# Patient Record
Sex: Female | Born: 1995 | Race: Black or African American | Hispanic: No | Marital: Single
Health system: Midwestern US, Community
[De-identification: ages and names within clinical notes are randomized; demographics above are authoritative.]

---
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
## 2016-04-18 ENCOUNTER — Emergency Department: Admit: 2016-04-18 | Payer: BLUE CROSS/BLUE SHIELD | Primary: Pediatrics

## 2016-04-18 ENCOUNTER — Inpatient Hospital Stay
Admit: 2016-04-18 | Discharge: 2016-04-18 | Disposition: A | Discharge status: Home / Self Care | Payer: BLUE CROSS/BLUE SHIELD | Attending: Emergency Medicine

## 2016-04-18 DIAGNOSIS — R0789 Other chest pain: Secondary | ICD-10-CM

## 2016-04-18 LAB — EKG 12-LEAD
Atrial Rate: 83 {beats}/min
Diagnosis: NORMAL
P Axis: 91 degrees
P-R Interval: 152 ms
Q-T Interval: 370 ms
QRS Duration: 98 ms
QTc Calculation (Bazett): 434 ms
R Axis: 77 degrees
T Axis: 62 degrees
Ventricular Rate: 83 {beats}/min

## 2016-04-18 LAB — POC URINE MACROSCOPIC
Bilirubin: NEGATIVE
Glucose: NEGATIVE mg/dl
Ketone: NEGATIVE mg/dl
Nitrites: NEGATIVE
Protein: NEGATIVE mg/dl
Specific gravity: 1.02 (ref 1.005–1.030)
Urobilinogen: 0.2 EU/dl (ref 0.0–1.0)
pH (UA): 7 (ref 5–9)

## 2016-04-18 LAB — METABOLIC PANEL, COMPREHENSIVE
ALT (SGPT): 19 U/L (ref 12–78)
AST (SGOT): 12 U/L — ABNORMAL LOW (ref 15–37)
Albumin: 3.8 gm/dl (ref 3.4–5.0)
Alk. phosphatase: 84 U/L (ref 45–117)
BUN: 13 mg/dl (ref 7–25)
Bilirubin, total: 0.4 mg/dl (ref 0.2–1.0)
CO2: 29 mEq/L (ref 21–32)
Calcium: 9.1 mg/dl (ref 8.5–10.1)
Chloride: 108 mEq/L — ABNORMAL HIGH (ref 98–107)
Creatinine: 1.2 mg/dl (ref 0.6–1.3)
GFR est AA: >60
GFR est non-AA: >60
Glucose: 76 mg/dl (ref 74–106)
Potassium: 3.4 mEq/L — ABNORMAL LOW (ref 3.5–5.1)
Protein, total: 7.5 gm/dl (ref 6.4–8.2)
Sodium: 145 mEq/L (ref 136–145)

## 2016-04-18 LAB — CBC WITH AUTOMATED DIFF
BASOPHILS: 0.5 % (ref 0–3)
EOSINOPHILS: 6.4 % — ABNORMAL HIGH (ref 0–5)
HCT: 40 % (ref 37.0–50.0)
HGB: 12.8 gm/dl — ABNORMAL LOW (ref 13.0–17.2)
IMMATURE GRANULOCYTES: 0.2 % (ref 0.0–3.0)
LYMPHOCYTES: 29.7 % (ref 28–48)
MCH: 26.3 pg (ref 25.4–34.6)
MCHC: 32 gm/dl (ref 30.0–36.0)
MCV: 82.3 fL (ref 80.0–98.0)
MONOCYTES: 10.1 % (ref 1–13)
MPV: 11.6 fL — ABNORMAL HIGH (ref 6.0–10.0)
NEUTROPHILS: 53.1 % (ref 34–64)
NRBC: 0 (ref 0–0)
PLATELET: 204 10*3/uL (ref 140–450)
RBC: 4.86 M/uL (ref 3.60–5.20)
RDW-SD: 46.4 — ABNORMAL HIGH (ref 36.4–46.3)
WBC: 5.8 10*3/uL (ref 4.0–11.0)

## 2016-04-18 LAB — EKG, 12 LEAD, INITIAL
Atrial Rate: 83 {beats}/min
Calculated P Axis: 91 degrees
Calculated R Axis: 77 degrees
Calculated T Axis: 62 degrees
Diagnosis: NORMAL
P-R Interval: 152 ms
Q-T Interval: 370 ms
QRS Duration: 98 ms
QTC Calculation (Bezet): 434 ms
Ventricular Rate: 83 {beats}/min

## 2016-04-18 LAB — POC HCG,URINE: HCG urine, QL: NEGATIVE

## 2016-04-18 LAB — POC URINE MICROSCOPIC

## 2016-04-18 NOTE — ED Triage Notes (Signed)
Pt states she has been having abdominal pain with vaginal bleeding and clots along with dizziness with the bleeding, states this has been going on for several months since IUD placed

## 2016-04-18 NOTE — ED Provider Notes (Signed)
[place]  Emergency Department Treatment Report    Patient: [name] Allen Age: 20 y.o. Sex: female    Date of Birth: November 15, 1995 Admit Date: 04/18/2016 PCP: Eliott Nine, MD   MRN: [id number]  CSN: [id number]     Room: ER31/ER31 Time Dictated: 12:22 PM      Chief Complaint   Chief Complaint   Patient presents with   ??? Vaginal Bleeding   ??? Dizziness   ??? Abdominal Pain       History of Present Illness   20 y.o. female presents to the ED today with multiple complaints, which she suspects are secondary to her [place] IUD placed (she believes) in February.  She reports daily bleeding since that time, associated with large clots.  She reports going through a pack of pads per day.  She reports having had some chest tightness/palpitations last night associated with near syncope.  She reports headaches, which she describes as severe migraines with ear buzzing for the last 3-4 days.  She reports that she frequently gets headaches, but these are different.  She has not taken any medication for these headaches, and she currently does not have one.  She reports they normally get better when she is sleeping.  She also complains of nausea and lower abdominal cramping since her IUD placement.  She is also having bilateral galactorrhea (L>R).  She denies current pregnancy.  She last had chest tightness this morning, which lasted about an hour and resolved with sleep.      Review of Systems   Constitutional: No fever, chills  Eyes: No visual symptoms.  ENT: No sore throat, runny nose   Respiratory: Shortness of breath during chest tightness episodes  Cardiovascular: As above  Gastrointestinal: Nausea and lower abdominal cramping  Genitourinary: As above  Musculoskeletal: No joint pain or swelling.  Integumentary: No rashes.  Neurological: As above  Denies complaints in all other systems.  Past Medical/Surgical History   No past medical history on file.  No past surgical history on file.   Hernia     Social History     Social History     Social History   ??? Marital status: SINGLE     Spouse name: N/A   ??? Number of children: N/A   ??? Years of education: N/A     Social History Main Topics   ??? Smoking status: Current Every Day Smoker   ??? Smokeless tobacco: Never Used   ??? Alcohol use No   ??? Drug use: No   ??? Sexual activity: Yes     Partners: Male     Birth control/ protection: Condom     Other Topics Concern   ??? Not on file     Social History Narrative       Family History   No family history on file.    Home Medications     (Not in a hospital admission)    Allergies   No Known Allergies    Physical Exam   ED Triage Vitals   Enc Vitals Group      BP 04/18/16 1152 132/92      Pulse (Heart Rate) 04/18/16 1152 74      Resp Rate 04/18/16 1152 20      Temp 04/18/16 1152 98.8 ??F (37.1 ??C)      Temp src --       O2 Sat (%) 04/18/16 1152 100 %      Weight 04/18/16 1138 106  lb      Height 04/18/16 1138 5' 3"       Head Cir --       Peak Flow --       Pain Score --       Pain Loc --       Pain Edu? --       Excl. in [place]? --      Constitutional: Patient appears well developed and well nourished. Marland Kitchen Appearance and behavior are age and situation appropriate.  HEENT: Conjunctiva clear. Mucous membranes moist.  Neck: supple, non tender.   Respiratory: lungs clear to auscultation, nonlabored respirations. No tachypnea or accessory muscle use.  Cardiovascular: heart regular rate and rhythm without murmur rubs or gallops.   Calves soft and non-tender. No peripheral edema or significant variscosities.    Gastrointestinal:  Abdomen soft, nontender without complaint of pain to palpation  Musculoskeletal: no deformities noted; moves all extremities without difficulty  Integumentary: warm and dry without rashes or lesions  Breasts:  Minimal white discharge from left nipple; no current discharge noted from right   Neurologic: alert and oriented x 3; No facial asymmetry or dysarthria.  Impression and Management Plan    20 year old female presents to the ED with multiple complaints, which she believes are related to her IUD, Liletta.  Will check labs, perform pelvic exam, rule out pregnancy, perform EKG/CXR for chest discomfort and proceed accordingly.  Patient PERC negative.   Diagnostic Studies   Lab:   Recent Results (from the past 12 hour(s))   CBC WITH AUTOMATED DIFF    Collection Time: 04/18/16 12:46 PM   Result Value Ref Range    WBC 5.8 4.0 - 11.0 1000/mm3    RBC 4.86 3.60 - 5.20 M/uL    HGB 12.8 (L) 13.0 - 17.2 gm/dl    HCT 40.0 37.0 - 50.0 %    MCV 82.3 80.0 - 98.0 fL    MCH 26.3 25.4 - 34.6 pg    MCHC 32.0 30.0 - 36.0 gm/dl    PLATELET 204 140 - 450 1000/mm3    MPV 11.6 (H) 6.0 - 10.0 fL    RDW-SD 46.4 (H) 36.4 - 46.3      NRBC 0 0 - 0      IMMATURE GRANULOCYTES 0.2 0.0 - 3.0 %    NEUTROPHILS 53.1 34 - 64 %    LYMPHOCYTES 29.7 28 - 48 %    MONOCYTES 10.1 1 - 13 %    EOSINOPHILS 6.4 (H) 0 - 5 %    BASOPHILS 0.5 0 - 3 %   METABOLIC PANEL, COMPREHENSIVE    Collection Time: 04/18/16 12:46 PM   Result Value Ref Range    Sodium 145 136 - 145 mEq/L    Potassium 3.4 (L) 3.5 - 5.1 mEq/L    Chloride 108 (H) 98 - 107 mEq/L    CO2 29 21 - 32 mEq/L    Glucose 76 74 - 106 mg/dl    BUN 13 7 - 25 mg/dl    Creatinine 1.2 0.6 - 1.3 mg/dl    GFR est AA >60.0      GFR est non-AA >60      Calcium 9.1 8.5 - 10.1 mg/dl    AST (SGOT) 12 (L) 15 - 37 U/L    ALT (SGPT) 19 12 - 78 U/L    Alk. phosphatase 84 45 - 117 U/L    Bilirubin, total 0.4 0.2 - 1.0 mg/dl  Protein, total 7.5 6.4 - 8.2 gm/dl    Albumin 3.8 3.4 - 5.0 gm/dl   EKG, 12 LEAD, INITIAL    Collection Time: 04/18/16 12:50 PM   Result Value Ref Range    Ventricular Rate 83 BPM    Atrial Rate 83 BPM    P-R Interval 152 ms    QRS Duration 98 ms    Q-T Interval 370 ms    QTC Calculation (Bezet) 434 ms    Calculated P Axis 91 degrees    Calculated R Axis 77 degrees    Calculated T Axis 62 degrees    Diagnosis       Normal sinus rhythm  Septal infarct , age undetermined   Possible Lateral infarct , age undetermined  Abnormal ECG  When compared with ECG of 24-May-2014 00:14,  Septal infarct is now present  No significant change was found  Confirmed by Tyrone Sage, M.D., Anjan ([id number]) on 04/18/2016 1:55:08 PM     POC URINE MACROSCOPIC    Collection Time: 04/18/16  1:13 PM   Result Value Ref Range    Glucose Negative NEGATIVE,Negative mg/dl    Bilirubin Negative NEGATIVE,Negative      Ketone Negative NEGATIVE,Negative mg/dl    Specific gravity 1.020 1.005 - 1.030      Blood Large (A) NEGATIVE,Negative      pH (UA) 7.0 5 - 9      Protein Negative NEGATIVE,Negative mg/dl    Urobilinogen 0.2 0.0 - 1.0 EU/dl    Nitrites Negative NEGATIVE,Negative      Leukocyte Esterase Trace (A) NEGATIVE,Negative      Color Yellow      Appearance Clear     POC URINE MICROSCOPIC    Collection Time: 04/18/16  1:13 PM   Result Value Ref Range    EPITHELIAL CELLS, SQUAMOUS OCCASIONAL /LPF    WBC 1-4 /HPF    RBC 1-4 /HPF    Bacteria OCCASIONAL /HPF   POC HCG,URINE    Collection Time: 04/18/16  1:15 PM   Result Value Ref Range    HCG urine, Ql. negative NEGATIVE,Negative,negative         Imaging:    Xr Chest Pa Lat    Result Date: 04/18/2016  Indication: Right chest pain for 2 days, smoking PA and lateral chest  The heart is normal in size. Lungs are clear and the bony thorax is intact. No effusions or  other abnormalities are seen.     Impression: Normal study.       EKG: sinus rhythm; no significant ST-T changes suggestive of ischemia  ED Course   Patient remained in stable condition. Pelvic exam revealed minimal blood in the vaginal vault.  Labs unremarkable.  EKG sinus rhythm.  I believe she needs further work-up, especially for her galactorrhea (possibly including prolactin level and MRI-brain).  She currently has no headache or chest discomfort.  Will have her follow up with PCP and OBGYN ASAP.    Medical Decision Making     Final Diagnosis       ICD-10-CM ICD-9-CM   1. Galactorrhea O92.6 611.6    2. Dysmenorrhea N94.6 625.3   3. Nonintractable headache, unspecified chronicity pattern, unspecified headache type R51 784.0   4. Chest tightness R07.89 786.59   5. Near syncope R55 780.2       Disposition   Patient discharged home in stable condition.     Payor: BLUE CR[employer] MEDICAID / Plan: [place] / Product Type: Managed Care Medicaid /  Minna Merritts, MD  April 18, 2016    My signature above authenticates this document and my orders, the final ??  diagnosis (es), discharge prescription (s), and instructions in the Epic ??  record.  If you have any questions please contact [phone].  ??  Nursing notes have been reviewed by the physician/ advanced practice ??  Clinician.    Dragon medical dictation software was used for portions of this report. Unintended voice recognition errors may occur.

## 2016-04-18 NOTE — ED Notes (Signed)
2:47 PM  04/18/16     Discharge instructions given to Melida QuitterShardaye R Righter   (name) with verbalization of understanding. Patient accompanied by friends.  Patient discharged with the following prescriptions none. Patient discharged to home (destination).      Coralee NorthAmanda Lyn Angely Dietz, RN

## 2016-06-19 ENCOUNTER — Encounter: Payer: Self-pay | Admitting: *Deleted

## 2016-06-19 ENCOUNTER — Emergency Department: Payer: BLUE CROSS/BLUE SHIELD

## 2016-06-19 ENCOUNTER — Emergency Department
Admission: EM | Admit: 2016-06-19 | Discharge: 2016-06-19 | Disposition: A | Discharge status: Home / Self Care | Payer: BLUE CROSS/BLUE SHIELD | Attending: Emergency Medicine | Admitting: Emergency Medicine

## 2016-06-19 DIAGNOSIS — N921 Excessive and frequent menstruation with irregular cycle: Secondary | ICD-10-CM

## 2016-06-19 DIAGNOSIS — T8389XA Other specified complication of genitourinary prosthetic devices, implants and grafts, initial encounter: Secondary | ICD-10-CM

## 2016-06-19 DIAGNOSIS — T8332XA Displacement of intrauterine contraceptive device, initial encounter: Secondary | ICD-10-CM | POA: Insufficient documentation

## 2016-06-19 DIAGNOSIS — N939 Abnormal uterine and vaginal bleeding, unspecified: Secondary | ICD-10-CM | POA: Diagnosis present

## 2016-06-19 DIAGNOSIS — Y762 Prosthetic and other implants, materials and accessory obstetric and gynecological devices associated with adverse incidents: Secondary | ICD-10-CM | POA: Diagnosis not present

## 2016-06-19 DIAGNOSIS — R102 Pelvic and perineal pain: Secondary | ICD-10-CM

## 2016-06-19 LAB — URINALYSIS COMPLETE WITH MICROSCOPIC (ARMC ONLY)
BACTERIA UA: NONE SEEN
BILIRUBIN URINE: NEGATIVE
Glucose, UA: NEGATIVE mg/dL
HGB URINE DIPSTICK: NEGATIVE
Ketones, ur: NEGATIVE mg/dL
NITRITE: NEGATIVE
PH: 7 (ref 5.0–8.0)
Protein, ur: NEGATIVE mg/dL
Specific Gravity, Urine: 1.019 (ref 1.005–1.030)

## 2016-06-19 LAB — POC URINE PREG, ED: Preg Test, Ur: NEGATIVE

## 2016-06-19 NOTE — ED Notes (Signed)
Patient transported to Ultrasound 

## 2016-06-19 NOTE — ED Triage Notes (Addendum)
States she feels like her birth control is falling out (IUD), and wants to be assessed

## 2016-06-19 NOTE — ED Provider Notes (Signed)
[facility] Emergency Department Provider Note  ____________________________________________  Time seen: Approximately 5:10 PM  I have reviewed the triage vital signs and the nursing notes.   HISTORY  Chief Complaint Abdominal Pain    HPI April Lee is a 20 y.o. female who complains of pelvic cramping and vaginal bleeding off and on for the past few weeks. She is concerned that this is related to her IUD. She had a Liletta IUD placed 4 months ago.This is a hormone-releasing device. She reports that after insertion she did not have any symptoms or periods for a few months, but now over the last couple months she has been having cramping in irregular bleeding. No chest pain shortness of breath or dizziness. No fever chills or sweats. She tried to call gynecology clinic today, but since they could not see her the same day she came to the ER instead.      History reviewed. No pertinent past medical history.   There are no active problems to display for this patient.    No past surgical history on file. IUD insertion  Prior to Admission medications   Not on File   None  Allergies Review of patient's allergies indicates no known allergies.   History reviewed. No pertinent family history.  Social History Social History  Substance Use Topics  . Smoking status: Not on file  . Smokeless tobacco: Not on file  . Alcohol use Not on file  No tobacco alcohol or drug use  Review of Systems  Constitutional:   No fever or chills.   Cardiovascular:   No chest pain. Respiratory:   No dyspnea or cough. Gastrointestinal:   Positive pelvic pain without vomiting or diarrhea.  Genitourinary:   Negative for dysuria or difficulty urinating. Positive vaginal bleeding without discharge Musculoskeletal:   Negative for focal pain or swelling 10-point ROS otherwise negative.  ____________________________________________   PHYSICAL EXAM:  VITAL  SIGNS: ED Triage Vitals [06/19/16 1503]  Enc Vitals Group     BP 111/72     Pulse Rate 80     Resp 18     Temp 98.4 F (36.9 C)     Temp Source Oral     SpO2 100 %     Weight 106 lb (48.1 kg)     Height 5\' 5"  (1.651 m)     Head Circumference      Peak Flow      Pain Score 6     Pain Loc      Pain Edu?      Excl. in [place]?     Vital signs reviewed, nursing assessments reviewed.   Constitutional:   Alert and oriented. Well appearing and in no distress. Eyes:   No scleral icterus. No conjunctival pallor. ENT   Head:   Normocephalic and atraumatic.      Mouth/Throat:   MMM, no pharyngeal erythema. No peritonsillar mass.    Neck:   No stridor. No SubQ emphysema. No meningismus. Hematological/Lymphatic/Immunilogical:   No cervical lymphadenopathy. Cardiovascular:   RRR. Symmetric bilateral radial and DP pulses.  No murmurs.  Respiratory:   Normal respiratory effort without tachypnea nor retractions. Breath sounds are clear and equal bilaterally. No wheezes/rales/rhonchi. Gastrointestinal:   Soft with mild suprapubic tenderness. Non distended. There is no CVA tenderness.  No rebound, rigidity, or guarding. Genitourinary:   deferred Musculoskeletal:   Nontender with normal range of motion in all extremities. No joint effusions.  No lower extremity tenderness.  No edema.  Neurologic:   Normal speech and language.  CN 2-10 normal. Motor grossly intact. No gross focal neurologic deficits are appreciated.  Skin:    Skin is warm, dry and intact. No rash noted.  No petechiae, purpura, or bullae.  ____________________________________________    LABS (pertinent positives/negatives) (all labs ordered are listed, but only abnormal results are displayed) Labs Reviewed  POC URINE PREG, ED   ____________________________________________   EKG    ____________________________________________    RADIOLOGY  Ultrasound pelvis reveals IUD in position. No other apparent  complications.  ____________________________________________   PROCEDURES Procedures  ____________________________________________   INITIAL IMPRESSION / ASSESSMENT AND PLAN / ED COURSE  Pertinent labs & imaging results that were available during my care of the patient were reviewed by me and considered in my medical decision making (see chart for details).  Patient presents with cramping and vaginal bleeding in the setting of fairly recent IUD placement. Low suspicion for uterine perforation or intra-abdominal infection or abscess. Low suspicion for TOA or ectopic or torsion. However, there is a possibility of migration which could be causing soft tissue injury. We will obtain an ultrasound to evaluate for complications and IUD localization. We'll then discuss with patient regarding whether she would like to have it removed in the ED versus following up with gynecology.     Clinical Course    ----------------------------------------- 6:42 PM on 06/19/2016 -----------------------------------------  Ultrasound reveals IUD in the uterus in expected position. No apparent complication. Had a discussion with the patient about her preference regarding waiting a little longer to see his symptoms resolve as her body adjusts versus just removing the IUD today. She likes to keep it in place and follow up with gynecology.  ____________________________________________   FINAL CLINICAL IMPRESSION(S) / ED DIAGNOSES  Final diagnoses:  Metrorrhagia  Pelvic cramping  IUD migration, initial encounter [facility](HCC)       Portions of this note were generated with dragon dictation software. Dictation errors may occur despite best attempts at proofreading.    Sharman CheekPhillip Miosotis Wetsel, MD 06/19/16 [phone]

## 2016-06-26 ENCOUNTER — Encounter: Payer: Self-pay | Admitting: *Deleted

## 2016-06-26 ENCOUNTER — Emergency Department
Admission: EM | Admit: 2016-06-26 | Discharge: 2016-06-26 | Disposition: A | Discharge status: Home / Self Care | Payer: BLUE CROSS/BLUE SHIELD | Attending: Emergency Medicine | Admitting: Emergency Medicine

## 2016-06-26 DIAGNOSIS — T8339XA Other mechanical complication of intrauterine contraceptive device, initial encounter: Secondary | ICD-10-CM | POA: Diagnosis not present

## 2016-06-26 DIAGNOSIS — Y762 Prosthetic and other implants, materials and accessory obstetric and gynecological devices associated with adverse incidents: Secondary | ICD-10-CM | POA: Insufficient documentation

## 2016-06-26 DIAGNOSIS — N760 Acute vaginitis: Secondary | ICD-10-CM | POA: Insufficient documentation

## 2016-06-26 DIAGNOSIS — B9689 Other specified bacterial agents as the cause of diseases classified elsewhere: Secondary | ICD-10-CM

## 2016-06-26 DIAGNOSIS — F17210 Nicotine dependence, cigarettes, uncomplicated: Secondary | ICD-10-CM | POA: Insufficient documentation

## 2016-06-26 DIAGNOSIS — R102 Pelvic and perineal pain: Secondary | ICD-10-CM

## 2016-06-26 LAB — URINALYSIS COMPLETE WITH MICROSCOPIC (ARMC ONLY)
BACTERIA UA: NONE SEEN
Bilirubin Urine: NEGATIVE
GLUCOSE, UA: NEGATIVE mg/dL
Ketones, ur: NEGATIVE mg/dL
NITRITE: NEGATIVE
Protein, ur: NEGATIVE mg/dL
SPECIFIC GRAVITY, URINE: 1.02 (ref 1.005–1.030)
pH: 6 (ref 5.0–8.0)

## 2016-06-26 LAB — PREGNANCY, URINE: Preg Test, Ur: NEGATIVE

## 2016-06-26 LAB — WET PREP, GENITAL
SPERM: NONE SEEN
Trich, Wet Prep: NONE SEEN
YEAST WET PREP: NONE SEEN

## 2016-06-26 LAB — CHLAMYDIA/NGC RT PCR (ARMC ONLY)
Chlamydia Tr: DETECTED — AB
N gonorrhoeae: NOT DETECTED

## 2016-06-26 MED ORDER — KETOROLAC TROMETHAMINE 60 MG/2ML IM SOLN
60.0000 mg | Freq: Once | INTRAMUSCULAR | Status: DC
  Filled 2016-06-26: qty 2

## 2016-06-26 MED ORDER — METRONIDAZOLE 500 MG PO TABS: 500.0000 mg | ORAL_TABLET | Freq: Two times a day (BID) | ORAL | 0 refills | Status: AC

## 2016-06-26 MED ORDER — IBUPROFEN 800 MG PO TABS: 800.0000 mg | ORAL_TABLET | Freq: Three times a day (TID) | ORAL | 0 refills | Status: AC | PRN

## 2016-06-26 NOTE — ED Provider Notes (Signed)
[facility] Emergency Department Provider Note  ____________________________________________  Time seen: Approximately 2:29 PM  I have reviewed the triage vital signs and the nursing notes.   HISTORY  Chief Complaint Abdominal Cramping    HPI April Lee is a 20 y.o. female 1 year postpartum from twins, with IUD placement 4 months ago, presenting for continued lower pelvic pain, associated with anorexia and nausea, a single blood clot 2 days ago, and change in vaginal discharge. The patient was seen here 8/23 for similar symptoms, and had reassuring evaluation including an ultrasound which showed normal IUD placement.  Since being discharged, she did not make a follow-up appointment with gynecology as directed. Her symptoms have persisted and are slightly worse. Her pain is not well controlled with Tylenol, Motrin, or Aleve. She has not had any fever, shaking chills, diarrhea. She is sexually active but reports she has not had any intercourse for 3 weeks.   History reviewed. No pertinent past medical history.  There are no active problems to display for this patient.   History reviewed. No pertinent surgical history.    Allergies Review of patient's allergies indicates no known allergies.  No family history on file.  Social History Social History  Substance Use Topics  . Smoking status: Current Every Day Smoker    Types: Cigarettes  . Smokeless tobacco: Never Used  . Alcohol use Yes    Review of Systems Constitutional: No fever/chills.No lightheadedness or syncope. Eyes: No visual changes. ENT: No sore throat. No congestion or rhinorrhea. Cardiovascular: Denies chest pain. Denies palpitations. Respiratory: Denies shortness of breath.  No cough. Gastrointestinal: Positive lower abdominal pain.  No nausea, no vomiting.  No diarrhea.  No constipation. Genitourinary: Negative for dysuria. Positive change in vaginal discharge. Positive single  blood clot 2 days ago. Musculoskeletal: Negative for back pain. Skin: Negative for rash. Neurological: Negative for headaches. No focal numbness, tingling or weakness.   10-point ROS otherwise negative.  ____________________________________________   PHYSICAL EXAM:  VITAL SIGNS: ED Triage Vitals [06/26/16 1029]  Enc Vitals Group     BP      Pulse      Resp      Temp      Temp src      SpO2      Weight 106 lb (48.1 kg)     Height 5\' 5"  (1.651 m)     Head Circumference      Peak Flow      Pain Score 10     Pain Loc      Pain Edu?      Excl. in GC?     Constitutional: Alert and oriented. Well appearing and in no acute distress. Answers questions appropriately. Eyes: Conjunctivae are normal.  EOMI. No scleral icterus. Head: Atraumatic. Nose: No congestion/rhinnorhea. Mouth/Throat: Mucous membranes are moist.  Neck: No stridor.  Supple.   Cardiovascular: Normal rate, regular rhythm. No murmurs, rubs or gallops.  Respiratory: Normal respiratory effort.  No accessory muscle use or retractions. Lungs CTAB.  No wheezes, rales or ronchi. Gastrointestinal: Soft and nondistended. Mildly tender to palpation in the lower abdomen and suprapubic area. No guarding or rebound.  No peritoneal signs. Genitourinary: Normal-appearing external genitalia without lesions. Normal vaginal exam with physiologic discharge, mild erythema 0.25cm around the cervical os, normal vaginal wall tissue.  No IUD strings are visible. Bimanual exam is negative for CMT, adnexal tenderness to palpation, no palpable masses.  The pt has mild suprapubic ttp on exam. Musculoskeletal:  No LE edema.  Neurologic:  A&Ox3.  Speech is clear.  Face and smile are symmetric.  EOMI.  Moves all extremities well. Skin:  Skin is warm, dry and intact. No rash noted. Psychiatric: Mood and affect are normal. Speech and behavior are normal.  Normal judgement.  ____________________________________________   LABS (all labs ordered  are listed, but only abnormal results are displayed)  Labs Reviewed  CHLAMYDIA/NGC RT PCR ([facility] ONLY)  PREGNANCY, URINE  URINALYSIS COMPLETEWITH MICROSCOPIC ([facility] ONLY)  POC URINE PREG, ED   ____________________________________________  EKG  Not indicated ____________________________________________  RADIOLOGY  No results found.  ____________________________________________   PROCEDURES  Procedure(s) performed: None  Procedures  Critical Care performed: No ____________________________________________   INITIAL IMPRESSION / ASSESSMENT AND PLAN / ED COURSE  Pertinent labs & imaging results that were available during my care of the patient were reviewed by me and considered in my medical decision making (see chart for details).  20 y.o. female presenting with lower abdominal and pelvic pain for several months, with change in vaginal discharge. At this point, the patient has no ultrasound which shows that her IUD is in place. A pelvic examination was deferred at her last visit, so we'll perform this to rule out an infectious cause for her symptoms. The patient requests IUD removal, which is not performed in the emergency department. I have recommended gynecology follow-up for further evaluation and treatment.  ----------------------------------------- 3:28 PM on 06/26/2016 -----------------------------------------  The patient states that she is feeling better.  I do not see the patient's IUD strings on examination, and her cervix does have some erythema. I will await the wet prep, but have encouraged her to follow up with the gynecologist on call for further evaluation and treatment. I have counseled her to use a barrier method of contraception while she is waiting for her IUD evaluation.  ----------------------------------------- 4:45 PM on 06/26/2016 -----------------------------------------  The patient's wet prep was positive for bacterial vaginosis, so  plan to send her home with a prescription for Flagyl. I think given her the name for the chronic oncologist on-call today. She will follow up within the next week. She shouldn't return precautions as well as follow-up instructions.  ____________________________________________  FINAL CLINICAL IMPRESSION(S) / ED DIAGNOSES  Final diagnoses:  None    Clinical Course      NEW MEDICATIONS STARTED DURING THIS VISIT:  New Prescriptions   No medications on file      Rockne Menghini, MD 06/26/16 1645

## 2016-06-26 NOTE — ED Notes (Signed)
Patient unable to use bathroom at this time , she has a cup and will try again for a sample.

## 2016-06-26 NOTE — ED Triage Notes (Signed)
Pt has a IUD in place, placed  A few months ago, pt seen her 2 weeks ago for pelvic pain and cramping, pt reports cramping and pain has continued, pt reports seeing a  Blood clot yesterday, pt reports spotting

## 2016-06-26 NOTE — Discharge Instructions (Addendum)
Please make an appointment with a gynecologist to discuss your pelvic cramping, and to discuss your IUD as the strings are not visible today. Please use condoms as a method of contraception to prevent pregnancy until you're able to see the gynecologist. Condoms will also help prevent sexual transmitted disease. Please do not resume sexual activity until they have been completely treated for your bacterial vaginosis.  Return to the emergency department if you develop severe pain, fever, inability to keep down fluids, or any other symptoms concerning to you.

## 2016-06-27 ENCOUNTER — Telehealth: Payer: Self-pay | Admitting: Emergency Medicine

## 2016-06-27 NOTE — Telephone Encounter (Signed)
Called pt to inform of positive chlamydia and need for treatment.  Also explained partner tx and availability of achd free treatment.  Called in azithromycin 1 gram po to [facility] [address] per dr Pershing Proudschaevitz.

## 2016-08-12 ENCOUNTER — Inpatient Hospital Stay
Admit: 2016-08-12 | Discharge: 2016-08-12 | Disposition: A | Discharge status: Home / Self Care | Payer: MEDICAID | Attending: Emergency Medicine

## 2016-08-12 DIAGNOSIS — N938 Other specified abnormal uterine and vaginal bleeding: Secondary | ICD-10-CM

## 2016-08-12 LAB — POC URINE MICROSCOPIC

## 2016-08-12 LAB — POC URINE MACROSCOPIC
Bilirubin: NEGATIVE
Blood: NEGATIVE
Glucose: NEGATIVE mg/dl
Ketone: NEGATIVE mg/dl
Nitrites: NEGATIVE
Protein: NEGATIVE mg/dl
Specific gravity: 1.01 (ref 1.005–1.030)
Urobilinogen: 0.2 EU/dl (ref 0.0–1.0)
pH (UA): 6 (ref 5–9)

## 2016-08-12 LAB — POC HCG,URINE: HCG urine, QL: NEGATIVE

## 2016-08-12 NOTE — ED Notes (Signed)
Pregnancy test negative

## 2016-08-12 NOTE — ED Notes (Signed)
I have reviewed discharge instructions with the patient.  The patient verbalized understanding.

## 2016-08-12 NOTE — ED Provider Notes (Signed)
[facility]  Emergency Department Treatment Report    Patient: [name] Allen Age: 20 y.o. Sex: female    Date of Birth: [birth year] Admit Date: 08/12/2016 PCP: Alfred Levins, MD   MRN: [id number]  CSN: [id number]  Attending: No attending physician found   Room: ER36/ER36 Time Dictated: 6:05 PM APP: Shireen Quan, PA-C     Chief Complaint   Chief Complaint   Patient presents with   ??? Pregnancy Problem        History of Present Illness     20 y.o. female who presents with complaints of cramping after taking a pregnancy tests at home and it was positive.  Patient recently transitioned from one birth control to another. She was late with her menstrual cycle by a week took a positive pregnancy test. She denies vaginal bleeding.    Review of Systems     Constitutional: No fever, chills, or weight loss  Eyes: No visual symptoms.  ENT: No sore throat, runny nose or ear pain.  Respiratory: No cough, dyspnea or wheezing.  Cardiovascular: No chest pain, pressure, palpitations, tightness or heaviness.  Gastrointestinal: No vomiting, diarrhea   Genitourinary: No dysuria, frequency, or urgency.  Musculoskeletal: No joint pain or swelling.  Integumentary: No rashes.  Neurological: No headaches, sensory or motor symptoms.  Denies complaints in all other systems.      Past Medical/Surgical History     History reviewed. No pertinent past medical history.  History reviewed. No pertinent surgical history.    [id number]    Social History     Social History     Social History   ??? Marital status: SINGLE     Spouse name: N/A   ??? Number of children: N/A   ??? Years of education: N/A     Social History Main Topics   ??? Smoking status: Current Every Day Smoker   ??? Smokeless tobacco: Never Used   ??? Alcohol use Yes   ??? Drug use: No   ??? Sexual activity: Yes     Partners: Male     Birth control/ protection: Condom     Other Topics Concern   ??? None     Social History Narrative       Family History     Family History    Problem Relation Age of Onset   ??? Diabetes Maternal Grandmother    ??? Diabetes Paternal Grandfather        Current Medications     Prior to Admission Medications   Prescriptions Last Dose Informant Patient Reported? Taking?   OTHER   Yes Yes   Sig: Birth Control pills--daily      Facility-Administered Medications: None       Allergies     No Known Allergies    Physical Exam     ED Triage Vitals   Enc Vitals Group      BP 08/12/16 0847 116/80      Pulse (Heart Rate) 08/12/16 0847 85      Resp Rate 08/12/16 0847 18      Temp 08/12/16 0847 98.1 ??F (36.7 ??C)      Temp src --       O2 Sat (%) 08/12/16 0847 100 %      Weight 08/12/16 0846 102 lb      Height 08/12/16 0846 5\' 3"       Head Cir --       Peak Flow --  Pain Score --       Pain Loc --       Pain Edu? --       Excl. in GC? --      Constitutional: Patient appears well developed and well nourished. Marland Kitchen. Appearance and behavior are age and situation appropriate.  HEENT: Conjunctivae clear.  PERRL. Mucous membranes moist, non-erythematous. Neck: supple, non tender, symmetrical, no masses or JVD.   Respiratory: lungs clear to auscultation, nonlabored respirations. No tachypnea or accessory muscle use.  Cardiovascular: heart regular rate and rhythm without murmur rubs or gallops. No peripheral edema or significant variscosities.    Gastrointestinal:  Abdomen soft, nontender without complaint of pain to palpation  Musculoskeletal: Nail beds pink with prompt capillary refill  Integumentary: warm and dry without rashes or lesions      Impression and Management Plan     20 year old afebrile nontoxic appearing female who presents emergent department with history of present illness as above. We'll order a urine pregnancy test urine dip.   Diagnostic Studies     Lab:   No results found for this or any previous visit (from the past 12 hour(s)).  Results for orders placed or performed during the hospital encounter of 08/12/16   POC URINE MACROSCOPIC   Result Value Ref Range     Glucose Negative NEGATIVE,Negative mg/dl    Bilirubin Negative NEGATIVE,Negative      Ketone Negative NEGATIVE,Negative mg/dl    Specific gravity 1.6101.010 1.005 - 1.030      Blood Negative NEGATIVE,Negative      pH (UA) 6.0 5 - 9      Protein Negative NEGATIVE,Negative mg/dl    Urobilinogen 0.2 0.0 - 1.0 EU/dl    Nitrites Negative NEGATIVE,Negative      Leukocyte Esterase Trace (A) NEGATIVE,Negative      Color Yellow      Appearance Clear     POC URINE MICROSCOPIC   Result Value Ref Range    Epithelial cells, squamous 5-9 /LPF    WBC OCCASIONAL /HPF    RBC 1-4 /HPF   POC HCG,URINE   Result Value Ref Range    HCG urine, Ql. negative NEGATIVE,Negative,negative         Imaging:    No results found.         ED Course     Medications - No data to display    Patient with a normal urine hCG test suspect that this is her normal cycle beginning to start.   She will follow-up with her OB/GYN repeating testing in another week.      Final Diagnosis       ICD-10-CM ICD-9-CM   1. DUB (dysfunctional uterine bleeding) N93.8 626.8       Disposition       Follow-up with OB/GYN. Retesting in a week is suggested. Return to ED if symptoms persist or worsen.    Rita OharaAdrienne L. Jaeshaun Riva,MPA,PA-C  August 13, 2016        The patient was personally evaluated by myself and Dr. Vinnie LangtonErik Kisa  who agrees with the above assessment and plan.    My signature above authenticates this document and my orders, the final ??  diagnosis (es), discharge prescription (s), and instructions in the Epic ??  record.  If you have any questions please contact [phone].  ??  Nursing notes have been reviewed by the physician/ advanced practice ??  Clinician.  Dragon medical dictation software was used for portions of this report. Unintended voice  recognition errors may occur.

## 2016-08-12 NOTE — ED Triage Notes (Signed)
Patient complains of pregnancy, she took a home test last week, last week she started having back pain, pelvic pain, and spotting.

## 2016-09-05 ENCOUNTER — Inpatient Hospital Stay
Admit: 2016-09-05 | Discharge: 2016-09-05 | Disposition: A | Discharge status: Home / Self Care | Payer: MEDICAID | Attending: Emergency Medicine

## 2016-09-05 DIAGNOSIS — Z202 Contact with and (suspected) exposure to infections with a predominantly sexual mode of transmission: Secondary | ICD-10-CM

## 2016-09-05 LAB — WET PREP

## 2016-09-05 NOTE — ED Notes (Signed)
I have reviewed discharge instructions with the patient.  The patient verbalized understanding.

## 2016-09-05 NOTE — ED Triage Notes (Signed)
Patient wants to get a STD check , patient has no symptoms but wants to get checked out. LMP was a week ago.
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
## 2016-09-07 LAB — CHLAMYDIA/NEISSERIA AMPLIFICATION
Chlamydia amplification: NEGATIVE
N. gonorrhoeae amplification: NEGATIVE

## 2016-11-14 ENCOUNTER — Inpatient Hospital Stay
Admit: 2016-11-14 | Discharge: 2016-11-14 | Disposition: A | Discharge status: Home / Self Care | Payer: MEDICAID | Attending: Emergency Medicine

## 2016-11-14 DIAGNOSIS — N760 Acute vaginitis: Secondary | ICD-10-CM

## 2016-11-14 LAB — POC URINE MACROSCOPIC
Bilirubin: NEGATIVE
Blood: NEGATIVE
Glucose: NEGATIVE mg/dl
Ketone: NEGATIVE mg/dl
Nitrites: NEGATIVE
Protein: NEGATIVE mg/dl
Specific gravity: 1.025 (ref 1.005–1.030)
Urobilinogen: 0.2 EU/dl (ref 0.0–1.0)
pH (UA): 6 (ref 5–9)

## 2016-11-14 LAB — POC HCG,URINE: HCG urine, QL: NEGATIVE

## 2016-11-14 LAB — WET PREP

## 2016-11-14 LAB — CT/GC DNA
CHLAMYDIA TRACHOMATIS DNA: NOT DETECTED
NEISSERIA GONORRHOEAE DNA: NOT DETECTED

## 2016-11-14 LAB — POC URINE MICROSCOPIC

## 2016-11-14 MED ORDER — METRONIDAZOLE 500 MG TAB
500 mg | ORAL_TABLET | Freq: Two times a day (BID) | ORAL | 0 refills | Status: AC
Start: 2016-11-14 — End: 2016-11-21

## 2016-11-14 NOTE — ED Notes (Signed)
1:21 PM  11/14/16     Discharge instructions given to patient (name) with verbalization of understanding. Patient accompanied by friend.  Patient discharged with the following prescriptions none. Patient discharged to home (destination). Pt ambulated out of the ED independently at time of discharge.     Ginger Carneerra C Key, RN

## 2016-11-14 NOTE — ED Triage Notes (Addendum)
Pt states she has had abdominal pain intermittently for the past week with nausea.  Denies fever or diarrhea.  C/o breast tenderness; states her last period was 12/14. Denies spotting.  States she took a home pregnancy test last week and it was negative.

## 2016-11-14 NOTE — ED Provider Notes (Signed)
[facility]  Emergency Department Treatment Report        Patient: [name] Allen Age: 21 y.o. Sex: female    Date of Birth: 11/07/1995 Admit Date: 11/14/2016 PCP: Alfred LevinsMichelle T Curry, MD   MRN: [id number]  CSN: [id number]  Attending:Dr. Arvella MerlesKisa    Room: ER37/ER37 Time Dictated: 2:23 PM NWG:[id number]:[name] Allen       Chief Complaint   Lower abdominal pain and nausea    History of Present Illness   21 y.o. female presents stating that she's had some symptoms of pregnancy.  She admits to some nausea and breast soreness for a few days with some lower abdominal pain.  Pain is bilateral, achy ,nonradiating, mild in intensity.  She states her last period was December 14 and she is not currently on birth control.  She had her IUD removed 4 months ago.  States she took a home pregnancy test last week but she did wrong and couldn't tell me what it resulted.    Review of Systems   Review of Systems   Constitutional: Negative for diaphoresis and fever.   HENT: Negative for congestion and sore throat.    Eyes: Negative for discharge and redness.   Respiratory: Negative for shortness of breath and wheezing.    Cardiovascular: Negative for leg swelling.   Gastrointestinal: Positive for abdominal pain and nausea. Negative for vomiting.   Genitourinary: Negative for dysuria and frequency.   Skin: Negative for itching and rash.       Past Medical/Surgical History   Negative for chronic illness  Gravida 1 para 412, has 20-year-old twins at home    Social History     Social History     Social History   ??? Marital status: SINGLE     Spouse name: N/A   ??? Number of children: N/A   ??? Years of education: N/A     Occupational History   ??? Not on file.     Social History Main Topics   ??? Smoking status: Current Every Day Smoker   ??? Smokeless tobacco: Never Used   ??? Alcohol use Yes   ??? Drug use: No   ??? Sexual activity: Yes     Partners: Male     Birth control/ protection: Condom     Other Topics Concern   ??? Not on file     Social History Narrative        Family History     Family History   Problem Relation Age of Onset   ??? Diabetes Maternal Grandmother    ??? Diabetes Paternal Grandfather        Current Medications     None     Allergies   No Known Allergies    Physical Exam   ED Triage Vitals   Enc Vitals Group      BP 11/14/16 1221 112/62      Pulse (Heart Rate) 11/14/16 1221 81      Resp Rate 11/14/16 1221 16      Temp 11/14/16 1221 98.8 ??F (37.1 ??C)      Temp src --       O2 Sat (%) 11/14/16 1221 100 %     Physical Exam   Constitutional: She is oriented to person, place, and time.   Constitutional:General appearance:  Patient appears well developed and well nourished.     HENT:   Head: Normocephalic and atraumatic.   Mouth/Throat: Oropharynx is clear and moist.   Eyes: Conjunctivae are  normal. Pupils are equal, round, and reactive to light.   Neck: Neck supple. No thyromegaly present.   Cardiovascular: Normal rate, regular rhythm and normal heart sounds.    No murmur heard.  Pulmonary/Chest: Effort normal. No respiratory distress. She has no wheezes.   Abdominal: Soft. She exhibits no distension and no mass. There is no tenderness. There is no rebound and no guarding.   Genitourinary:   Genitourinary Comments: Pelvic-yellow whitish  vaginal discharge, no cervical motion tenderness, no uterine adnexal masses appreciable on exam.  No vaginal ulcerations   Musculoskeletal: She exhibits no deformity.   Lymphadenopathy:     She has no cervical adenopathy.   Neurological: She is alert and oriented to person, place, and time.   Skin: Skin is warm and dry. No rash noted.   Vitals reviewed.       Impression and Management Plan   This is a patient who has no abdominal tenderness but concerns of possible pregnancy with some pregnancy symptoms we will rule out pregnancy, with the discharge STD and wet prep sent.    Diagnostic Studies   Lab:   Recent Results (from the past 12 hour(s))   POC URINE MACROSCOPIC    Collection Time: 11/14/16 12:27 PM   Result Value Ref Range     Glucose Negative NEGATIVE,Negative mg/dl    Bilirubin Negative NEGATIVE,Negative      Ketone Negative NEGATIVE,Negative mg/dl    Specific gravity 8.119 1.005 - 1.030      Blood Negative NEGATIVE,Negative      pH (UA) 6.0 5 - 9      Protein Negative NEGATIVE,Negative mg/dl    Urobilinogen 0.2 0.0 - 1.0 EU/dl    Nitrites Negative NEGATIVE,Negative      Leukocyte Esterase Trace (A) NEGATIVE,Negative      Color Yellow      Appearance Clear     POC URINE MICROSCOPIC    Collection Time: 11/14/16 12:27 PM   Result Value Ref Range    Epithelial cells, squamous 1-4 /LPF    WBC OCCASIONAL /HPF   POC HCG,URINE    Collection Time: 11/14/16 12:33 PM   Result Value Ref Range    HCG urine, Ql. negative NEGATIVE,Negative,negative     WET PREP    Collection Time: 11/14/16  1:10 PM   Result Value Ref Range    Wet prep        Clue Cells Seen  No Yeast or motile Trichomonas seen       Labs Reviewed   POC URINE MACROSCOPIC - Abnormal; Notable for the following:        Result Value    Leukocyte Esterase Trace (*)     All other components within normal limits   CT/GC DNA   WET PREP   POC URINE MICROSCOPIC   POC HCG,URINE   POC URINE PREGNANCY TEST       ED Course   Patient advised of her negative pregnancy test, advised of the positive clue cells and with the discharge we will treat her for vaginosis  She is advised of pending STD cultures   Final Diagnosis       ICD-10-CM ICD-9-CM   1. Vaginitis and vulvovaginitis N76.0 616.10   2. Irregular menses N92.6 626.4         Disposition   Told to follow-up with GYN for irregular menses and a prescription for Flagyl given       The patient was personally evaluated by myself and discussed with  Dr. Arvella Merles  who agrees with the above assessment and plan.    Farrel Demark, PA-C  November 14, 2016    My signature above authenticates this document and my orders, the final ??  diagnosis (es), discharge prescription (s), and instructions in the Epic ??  record.   If you have any questions please contact [phone].  ??  Nursing notes have been reviewed by the physician/ advanced practice ??  Clinician.    Dragon medical dictation software was used for portions of this report. Unintended voice recognition errors may occur.

## 2017-02-18 DIAGNOSIS — J069 Acute upper respiratory infection, unspecified: Secondary | ICD-10-CM

## 2017-02-18 NOTE — ED Provider Notes (Signed)
[facility]  Emergency Department Treatment Report    Patient: [name] Allen Age: 21 y.o. Sex: female    Date of Birth: [birth year] Admit Date: 02/18/2017 PCP: Alfred Levins, MD   MRN: [id number]  CSN: [id number]  Attending: Gwenyth Allegra, MD   Room: [id number] Time Dictated: 9:38 PM APP: Ahmed Prima, PA-C     Chief Complaint   Chief Complaint   Patient presents with   ??? Headache       History of Present Illness   21 y.o. female c/o constant headache, severe nasal congestion, and rhinorrhea x 1 week. Symptoms have gradually worsened over the past 2 days when she also developed bilateral ear pressure and a cough. Headache is 8/10 generalized pressure-like. Headache worsens with head movement and especially when she lies down or bends forward. Both of her daughters have been sick with colds. She's taken Dayquil, Aspirin, and Motrin with no relief. Last took Motrin at 17:00.      Review of Systems   Constitutional: No fever, chills, sweats  Eyes: No visual symptoms  ENT: See HPI. No sore throat or ear pain   RESP: See HPI. No SOB, wheeze  GI: No vomiting, diarrhea, abdominal pain.   GU: No urinary symptoms  MSK: No myalgias  SKIN: No rashes  NEURO: See HPI. No headedness/dizziness.    Past Medical/Surgical History   No medical issues.     Social History     Social History     Social History   ??? Marital status: SINGLE     Spouse name: N/A   ??? Number of children: N/A   ??? Years of education: N/A     Social History Main Topics   ??? Smoking status: Current Every Day Smoker   ??? Smokeless tobacco: Never Used   ??? Alcohol use Yes   ??? Drug use: No   ??? Sexual activity: Yes     Partners: Male     Birth control/ protection: Condom     Other Topics Concern   ??? Not on file     Social History Narrative     Patient denies pregnancy  Family History     Family History   Problem Relation Age of Onset   ??? Diabetes Maternal Grandmother    ??? Diabetes Paternal Grandfather        Current Medications     None      Allergies   No Known Allergies  Physical Exam   ED Triage Vitals   Enc Vitals Group      BP 02/18/17 2054 105/72      Pulse (Heart Rate) 02/18/17 2054 90      Resp Rate 02/18/17 2054 18      Temp 02/18/17 2054 99.5 ??F (37.5 ??C)      Temp src --       O2 Sat (%) 02/18/17 2054 100 %      Weight 02/18/17 2030 102 lb      Height 02/18/17 2030       Head Cir --       Peak Flow --       Pain Score --       Pain Loc --       Pain Edu? --       Excl. in GC? --      Constitutional: Well developed and well nourished. She sounds very stuffy/congested.   EYES: Conjuctivae clear, lids normal.  Pupils equal,  symmetrical, and normally reactive.  ENT: EARS: Canals and TM's are clear bilaterally. MOUTH/THROAT: Oral mucosa is pink, moist and without lesions. Uvula midline. Posterior pharynx no erythema, no tonsillar swelling or exudates. Airway patent, no stridor.   NECK: Soft, supple, nontender  RESP: Lungs clear bilaterally. No wheezing, rhonchi, crackles, or rales. No respiratory distress, tachypnea, or accessory muscle use.  CV: Heart: Regular, without murmurs, gallops, rubs, or thrills.  MSK: Moves all 4 extremities spontaneously.   SKIN: Warm and dry.   NEURO: Alert, oriented x 3. EOM intact, no nystagmus. Sensation intact, motor strength equal and symmetric. Stance and gait normal, no ataxia.     Impression and Management Plan   Patient complaining of headache with associated upper respiratory symptoms and cough.  She is well-appearing.  VSS.  Sounds extremely stuffy/congested.  Exam otherwise unremarkable. Headache likely 2/2 severe nasal congestion.  Consider sinusitis, viral URI. She's had no fevers and is otherwise healthy therefore I do not think antibiotics are warranted. Will treat symptomatically.     Diagnostic Studies   None.     ED Course/Medical Decision Making     Medications   ketorolac (TORADOL) injection 30 mg (30 mg IntraMUSCular Refused 02/18/17 2140)    dexamethasone (DECADRON) tablet 10 mg (10 mg Oral Given 02/18/17 2140)   pseudoephedrine (SUDAFED) tablet 60 mg (60 mg Oral Given 02/18/17 2216)     She received Decadron PO and Sudafed PO. She declined Toradol.    Patient instructed to encourage fluids and blow her nose.  Given ibuprofen prescription.  Recommend OTC Afrin nasal spray ??3 days for nasal congestion.  Warned not to take Afrin longer than 3 days as this can lead to rebound congestion.  Instructed to take OTC Sudafed and Flonase for nasal congestion.  Instructed to follow-up with PCP in 3-4 days for recheck.  Return to ER immediately for new or worsening symptoms.  Work note given.  She is happy and agreeable plan.    Final Diagnosis       ICD-10-CM ICD-9-CM   1. Acute nonintractable headache, unspecified headache type R51 784.0   2. URI, acute J06.9 465.9       Disposition   Stable condition.     Scribe Attestation     Renford Dills acting as a Neurosurgeon for and in the presence of Ahmed Prima, PA-C      February 18, 2017 at 9:38 PM       Provider Attestation:      I personally performed the services described in the documentation, reviewed the documentation, as recorded by the scribe in my presence, and it accurately and completely records my words and actions. February 18, 2017 at 9:38 PM - Ahmed Prima, PA-C      Ahmed Prima, PA-C  February 18, 2017    The patient was fully discussed with attending Gwenyth Allegra, MD who agrees with the above assessment and plan.    Nursing notes have been reviewed by the physician/ advanced practice Clinician.    Dragon medical dictation was used for portions of this report. Unintended grammatical errors may occur.    My signature above authenticates this document and my orders, the final diagnosis (es), discharge prescription (s), and instructions in the Epic record. If you have any questions please contact [phone].

## 2017-02-18 NOTE — ED Notes (Signed)
Pt refused Toradol injection, stating "a friend of mine had it & said it didn't do well for her body."

## 2017-02-18 NOTE — ED Triage Notes (Signed)
Pt with c/o diffuse headache since Sunday, no injury, today pain is worse, no visual changes, some nausea but now resolved. Pt with nasal congestion.

## 2017-02-18 NOTE — ED Notes (Signed)
10:48 PM  02/18/17     Discharge instructions given to pt (name) with verbalization of understanding. Patient accompanied by .  Patient discharged with the following prescriptionsPatient discharged to home (destination).      [name] Allen, Charity fundraiser

## 2017-02-19 ENCOUNTER — Inpatient Hospital Stay
Admit: 2017-02-19 | Discharge: 2017-02-19 | Disposition: A | Discharge status: Home / Self Care | Payer: Self-pay | Attending: Emergency Medicine

## 2017-02-19 MED ORDER — DEXAMETHASONE 4 MG TAB
4 mg | Freq: Once | ORAL | Status: AC
Start: 2017-02-19 — End: 2017-02-18
  Administered 2017-02-19: 02:00:00 via ORAL

## 2017-02-19 MED ORDER — KETOROLAC TROMETHAMINE 30 MG/ML INJECTION
30 mg/mL (1 mL) | INTRAMUSCULAR | Status: DC
Start: 2017-02-19 — End: 2017-02-19

## 2017-02-19 MED ORDER — PSEUDOEPHEDRINE 30 MG TAB
30 mg | ORAL | Status: AC
Start: 2017-02-19 — End: 2017-02-18
  Administered 2017-02-19: 02:00:00 via ORAL

## 2017-02-19 MED ORDER — IBUPROFEN 800 MG TAB
800 mg | ORAL_TABLET | Freq: Three times a day (TID) | ORAL | 0 refills | Status: DC | PRN
Start: 2017-02-19 — End: 2018-04-16

## 2017-02-19 MED FILL — KETOROLAC TROMETHAMINE 30 MG/ML INJECTION: 30 mg/mL (1 mL) | INTRAMUSCULAR | Qty: 1

## 2017-02-19 MED FILL — SUDOGEST 30 MG TABLET: 30 mg | ORAL | Qty: 2

## 2017-02-19 MED FILL — DEXAMETHASONE 4 MG TAB: 4 mg | ORAL | Qty: 3

## 2017-03-22 ENCOUNTER — Inpatient Hospital Stay
Admit: 2017-03-22 | Discharge: 2017-03-22 | Disposition: A | Discharge status: Home / Self Care | Payer: MEDICAID | Attending: Emergency Medicine

## 2017-03-22 DIAGNOSIS — N3000 Acute cystitis without hematuria: Secondary | ICD-10-CM

## 2017-03-22 LAB — POC URINE MACROSCOPIC
Bilirubin: NEGATIVE
Glucose: NEGATIVE mg/dl
Ketone: NEGATIVE mg/dl
Nitrites: NEGATIVE
Protein: NEGATIVE mg/dl
Specific gravity: 1.015 (ref 1.005–1.030)
Urobilinogen: 0.2 EU/dl (ref 0.0–1.0)
pH (UA): 6 (ref 5–9)

## 2017-03-22 LAB — POC HCG,URINE: HCG urine, QL: NEGATIVE

## 2017-03-22 LAB — POC URINE MICROSCOPIC: WBC: >50 /HPF

## 2017-03-22 LAB — CT/GC DNA
CHLAMYDIA TRACHOMATIS DNA: NOT DETECTED
NEISSERIA GONORRHOEAE DNA: DETECTED — AB

## 2017-03-22 LAB — WET PREP: Wet prep: NONE SEEN

## 2017-03-22 MED ORDER — ONDANSETRON 4 MG TAB, RAPID DISSOLVE
4 mg | ORAL | Status: AC
Start: 2017-03-22 — End: 2017-03-22
  Administered 2017-03-22: 06:00:00 via ORAL

## 2017-03-22 MED ORDER — NITROFURANTOIN (25% MACROCRYSTAL FORM) 100 MG CAP
100 mg | ORAL_CAPSULE | Freq: Two times a day (BID) | ORAL | 0 refills | Status: AC
Start: 2017-03-22 — End: 2017-03-25

## 2017-03-22 MED ORDER — AZITHROMYCIN 250 MG TAB
250 mg | ORAL | Status: AC
Start: 2017-03-22 — End: 2017-03-22
  Administered 2017-03-22: 06:00:00 via ORAL

## 2017-03-22 MED ORDER — LIDOCAINE (PF) 10 MG/ML (1 %) IJ SOLN
101 mg/mL (1 %) | Freq: Once | INTRAMUSCULAR | Status: AC
Start: 2017-03-22 — End: 2017-03-22
  Administered 2017-03-22: 06:00:00 via INTRAMUSCULAR

## 2017-03-22 MED FILL — ONDANSETRON 4 MG TAB, RAPID DISSOLVE: 4 mg | ORAL | Qty: 1

## 2017-03-22 MED FILL — LIDOCAINE (PF) 10 MG/ML (1 %) IJ SOLN: 10 mg/mL (1 %) | INTRAMUSCULAR | Qty: 5

## 2017-03-22 MED FILL — AZITHROMYCIN 250 MG TAB: 250 mg | ORAL | Qty: 4

## 2017-03-22 MED FILL — CEFTRIAXONE 250 MG SOLUTION FOR INJECTION: 250 mg | INTRAMUSCULAR | Qty: 250

## 2017-03-22 NOTE — ED Notes (Signed)
2:49 AM  03/22/17     Discharge instructions given to patient (name) with verbalization of understanding. Patient accompanied by none.  Patient discharged with the following prescriptions macrobid. Patient discharged to home (destination).      Jade Burkard Jackelyn KnifeUyen T. Niti Leisure, RN

## 2017-03-22 NOTE — ED Triage Notes (Signed)
Pt c/o urinary frequency and burning sensation x 1 wk with pelvic pain

## 2017-03-22 NOTE — ED Provider Notes (Addendum)
[facility]  Emergency Department Treatment Report    Patient: [name] Allen Age: 21 y.o. Sex: female    Date of Birth: Aug 30, 1996 Admit Date: 03/22/2017 PCP: Alfred Levins, MD   MRN: [id number]  CSN: [id number]  ATTENDING: Dr. Jama Flavors   Room: ER24/ER24 Time Dictated: 2:12 AM APP: Tawni Levy, PA-C       Chief Complaint   Dysuria.    History of Present Illness   This is a 21 y.o. year old female who presents with complaints of dysuria. The patient complains of burning with urination ever since having unprotected sex 2 weeks ago. She denies any abdominal pain. She states she has a history of chlamydia and the symptoms feel very similar to her. She denies any hematuria, no fever chills, no nausea or vomiting.    Review of Systems   CONSTITUTIONAL: Negative for fever, chills, or myalgias.  ENT: Positive for recently treated size infection. Negative for current nasal drainage or congestion.  NECK/LYMPH: Negative for neck pain or stiffness.  RESPIRATORY: Negative for shortness of breath or cough.   CARDIOVASCULAR: Negative for chest pain or palpitations.  GASTROINTESTINAL: Negative for abdominal pain, nausea, vomiting, diarrhea.  GENITOURINARY: Positive for dysuria with some pelvic discomfort. Negative for abnormal vaginal discharge.  MUSCULOSKELETAL: Negative for pain or swelling.  NEUROLOGIC: Negative for altered mental status, dizziness, headache, numbness, or focal weakness.  INTEGUMENTARY: Negative for skin discoloration or rash.    Past Medical  History   Chlamydia.    Past Surgical History   History reviewed. No pertinent surgical history.    Social History     Social History     Social History   ??? Marital status: SINGLE     Spouse name: N/A   ??? Number of children: N/A   ??? Years of education: N/A     Social History Main Topics   ??? Smoking status: Current Every Day Smoker   ??? Smokeless tobacco: Never Used   ??? Alcohol use Yes   ??? Drug use: No   ??? Sexual activity: Yes      Partners: Male     Birth control/ protection: Condom     Other Topics Concern   ??? None     Social History Narrative       Family History     Family History   Problem Relation Age of Onset   ??? Diabetes Maternal Grandmother    ??? Diabetes Paternal Grandfather        Current Medications     Prior to Admission Medications   Prescriptions Last Dose Informant Patient Reported? Taking?   ibuprofen (MOTRIN) 800 mg tablet   No No   Sig: Take 1 Tab by mouth every eight (8) hours as needed for Pain (and inflammation.).      Facility-Administered Medications: None       Allergies   No Known Allergies    Physical Exam     Visit Vitals   ??? BP 114/70 (BP 1 Location: Left arm, BP Patient Position: Sitting)   ??? Pulse 73   ??? Temp 98.1 ??F (36.7 ??C)   ??? Resp 20   ??? Ht 5\' 3"  (1.6 m)   ??? Wt 46.3 kg (102 lb)   ??? SpO2 100%   ??? BMI 18.07 kg/m2     GENERAL: This is a well developed, well nourished female.  HEAD: Normocephalic, atraumatic.  EYES: Pupils appear equally round and reactive to light bilaterally, EOMs appear  grossly intact bilaterally.  ENT: No appreciated nasal discharge. Oral mucosa appears pink and moist.  CARDIAC: Heart is regular in rate and rhythm, no auscultated murmurs, rubs, or gallops.  RESPIRATORY: No appreciated respiratory distress or labored breathing, breathing comfortably at rest. Lungs are clear to auscultation.  ABDOMEN: Abdomen is soft and supple with no appreciated tenderness to palpation of any of the four quadrants. No appreciated guarding or palpable masses.  PELVIC: Normal female external genitalia. There is some noted moderate discharge in the vault. Cervix is normal in appearance with no masses or lesions. No cervical motion tenderness.  MUSCULOSKELETAL: Moving all extremities with equal strength. No appreciated edema bilaterally.  NEUROLOGIC: Alert and oriented x 4. No appreciated facial asymmetry or gross focal weakness.   PSYCH: Calm and cooperative.   SKIN: No appreciated rash, lesions, or skin discoloration.     Diagnostic Studies   Laboratory Data:   Recent Results (from the past 12 hour(s))   POC URINE MACROSCOPIC    Collection Time: 03/22/17  1:24 AM   Result Value Ref Range    Glucose Negative NEGATIVE,Negative mg/dl    Bilirubin Negative NEGATIVE,Negative      Ketone Negative NEGATIVE,Negative mg/dl    Specific gravity 1.610 1.005 - 1.030      Blood Small (A) NEGATIVE,Negative      pH (UA) 6.0 5 - 9      Protein Negative NEGATIVE,Negative mg/dl    Urobilinogen 0.2 0.0 - 1.0 EU/dl    Nitrites Negative NEGATIVE,Negative      Leukocyte Esterase Moderate (A) NEGATIVE,Negative      Color Yellow      Appearance Clear     POC URINE MICROSCOPIC    Collection Time: 03/22/17  1:24 AM   Result Value Ref Range    Epithelial cells, squamous 5-9 /LPF    WBC >50 /HPF    RBC 5-9 /HPF    Bacteria 2+ /HPF   POC HCG,URINE    Collection Time: 03/22/17  1:26 AM   Result Value Ref Range    HCG urine, QL negative NEGATIVE,Negative,negative     WET PREP    Collection Time: 03/22/17  1:30 AM   Result Value Ref Range    Wet prep        No Yeast or motile Trichomonas seen  No Clue Cells Seen       ED Course and Medical Decision Making   Pregnancy test is negative. The patient was very concerned for STDs and was treated with 250 mg of Rocephin IM and 1,000 mg of azithromycin as she will be traveling tomorrow and preferred this treatment. Her urine is positive for evidence of infection with over 50 white blood cells and 2+ bacteria and positive leukocyte esterase. She'll be treated with a three-day course of Macrobid. She is made aware of her results and demonstrates understanding of all aftercare instructions.  No further treatment needed at this time, abdomen is benign.    Management Plan   The patient was instructed to take prescribed medication as directed.  The patient was instructed to follow up with gynecologist in the next week  days for regular follow-up and any further needed management. The patient was instructed to return to the ER immediately if condition worsens.  The patient demonstrates understanding of all aftercare instructions and all their questions were answered.    Impression       ICD-10-CM ICD-9-CM   1. Acute cystitis without hematuria N30.00 595.0   2. Dysuria  R30.0 788.1       Disposition   The patient's symptoms are stable for outpatient management.Myrtie Soman.     Detra Bores, PA-C  Mar 22, 2017    My signature above authenticates this document and my orders, the final ??  diagnosis (es), discharge prescription (s), and instructions in the Epic ??  record.  If you have any questions please contact [phone].  ??  Nursing notes have been reviewed by the physician/ advanced practice ??  Clinician.

## 2017-03-22 NOTE — Other (Signed)
LAB  The patient was informed of her positive  gonorrhea culture. She was treated at the time of her ER visit. Standard STD instructions given and she was referred to the health department , the patient verbalized her understanding. The patient had no questions.

## 2017-11-18 ENCOUNTER — Inpatient Hospital Stay
Admit: 2017-11-18 | Discharge: 2017-11-18 | Disposition: A | Discharge status: Home / Self Care | Payer: PRIVATE HEALTH INSURANCE | Attending: Emergency Medicine

## 2017-11-18 DIAGNOSIS — N760 Acute vaginitis: Secondary | ICD-10-CM

## 2017-11-18 DIAGNOSIS — A549 Gonococcal infection, unspecified: Secondary | ICD-10-CM

## 2017-11-18 LAB — POC URINE MACROSCOPIC
Bilirubin: NEGATIVE
Glucose: NEGATIVE mg/dl
Ketone: NEGATIVE mg/dl
Nitrites: NEGATIVE
Specific gravity: >=1.03 (ref 1.005–1.030)
Urobilinogen: 0.2 EU/dl (ref 0.0–1.0)
pH (UA): 6 (ref 5–9)

## 2017-11-18 LAB — WET PREP: Wet prep: NONE SEEN

## 2017-11-18 LAB — POC URINE MICROSCOPIC

## 2017-11-18 LAB — POC HCG,URINE: HCG urine, QL: NEGATIVE

## 2017-11-18 LAB — CT/GC DNA
CHLAMYDIA TRACHOMATIS DNA: NOT DETECTED
NEISSERIA GONORRHOEAE DNA: DETECTED — AB

## 2017-11-18 MED ORDER — METRONIDAZOLE 500 MG TAB
500 mg | ORAL_TABLET | Freq: Two times a day (BID) | ORAL | 0 refills | Status: AC
Start: 2017-11-18 — End: 2017-11-25

## 2017-11-18 MED ORDER — ALBUTEROL SULFATE 90 MCG/ACTUATION BREATH ACTIVATED POWDER INHALER
90 mcg/actuation | RESPIRATORY_TRACT | 0 refills | Status: DC | PRN
Start: 2017-11-18 — End: 2018-04-16

## 2017-11-18 NOTE — ED Notes (Signed)
Pt is back to be treated for +std.

## 2017-11-18 NOTE — ED Provider Notes (Signed)
[facility] Care  Emergency Department Treatment Report        Patient: VERNESHA TALBOT Age: 22 y.o. Sex: female    Date of Birth: 1995/11/17 Admit Date: 11/18/2017 PCP: Alfred Levins, MD   MRN: [id number]  CSN: [id number]  Attending: Elveria Royals, MD   Room: ER08/ER08 Time Dictated: 8:52 PM APP: Caren Griffins, PA-C     Chief Complaint   Chief Complaint   Patient presents with   ??? Sexually Transmitted Disease       History of Present Illness   22 y.o. female who presents for treatment of gonorrhea. Patient was evaluated here this morning with complaints of cough and also requesting STD testing. She had been having some mild lower pelvic discomfort, vaginal itching and dysuria which was reminiscent of prior STIs.      Review of Systems   Review of Systems   Constitutional: Negative for chills and fever.   Gastrointestinal: Positive for abdominal pain (mild pelvic discomfort ).   Genitourinary: Positive for dysuria. Negative for frequency.   Musculoskeletal: Negative for back pain.   Skin: Positive for itching.       Past Medical/Surgical History   No past medical history on file.  No past surgical history on file.    Social History     Social History     Socioeconomic History   ??? Marital status: SINGLE     Spouse name: Not on file   ??? Number of children: Not on file   ??? Years of education: Not on file   ??? Highest education level: Not on file   Social Needs   ??? Financial resource strain: Not on file   ??? Food insecurity - worry: Not on file   ??? Food insecurity - inability: Not on file   ??? Transportation needs - medical: Not on file   ??? Transportation needs - non-medical: Not on file   Occupational History   ??? Not on file   Tobacco Use   ??? Smoking status: Current Every Day Smoker   ??? Smokeless tobacco: Never Used   Substance and Sexual Activity   ??? Alcohol use: Yes   ??? Drug use: No   ??? Sexual activity: Yes     Partners: Male     Birth control/protection: Condom   Other Topics Concern    ??? Not on file   Social History Narrative   ??? Not on file       Family History     Family History   Problem Relation Age of Onset   ??? Diabetes Maternal Grandmother    ??? Diabetes Paternal Grandfather        Current Medications     (Not in a hospital admission)    Allergies   No Known Allergies    Physical Exam     ED Triage Vitals [11/18/17 2001]   ED Encounter Vitals Group      BP 107/59      Pulse (Heart Rate) (!) 105      Resp Rate 18      Temp 100 ??F (37.8 ??C)      Temp src       O2 Sat (%) 100 %      Weight 110 lb      Height 5\' 4"      Physical Exam   Constitutional: She is oriented to person, place, and time and well-developed, well-nourished, and in no distress.   HENT:  Head: Normocephalic and atraumatic.   Eyes: Conjunctivae are normal.   Cardiovascular: Normal rate and regular rhythm.   Pulmonary/Chest: Effort normal and breath sounds normal. No respiratory distress. She has no wheezes.   Abdominal: Soft. She exhibits no distension. There is no tenderness.   Neurological: She is alert and oriented to person, place, and time.   Skin: Skin is warm and dry.        Impression and Management Plan   This is a 22 y.o. female who presents for treatment for gonorrhea after being evaluated this morning. On arrival she was noted to be mildly tachycardic with oral temp of 100.0. Given complaints of cough that she was having this morning, she was offered CXR and flu swab but declines at this time stating she just want to get the shot and go home. Discussed with patient we frequently treat for chalmydia with positive gonorrhea test and informed her the chlamydia test was negative, however offered the treatment, however she declined.     Diagnostic Studies   Lab:   Recent Results (from the past 12 hour(s))   POC URINE MACROSCOPIC    Collection Time: 11/18/17  1:14 PM   Result Value Ref Range    Glucose Negative NEGATIVE,Negative mg/dl    Bilirubin Negative NEGATIVE,Negative      Ketone Negative NEGATIVE,Negative mg/dl     Specific gravity >=4.403>=1.030 1.005 - 1.030      Blood Trace-intact (A) NEGATIVE,Negative      pH (UA) 6.0 5 - 9      Protein Trace (A) NEGATIVE,Negative mg/dl    Urobilinogen 0.2 0.0 - 1.0 EU/dl    Nitrites Negative NEGATIVE,Negative      Leukocyte Esterase Small (A) NEGATIVE,Negative      Color Yellow      Appearance Slightly Cloudy     POC URINE MICROSCOPIC    Collection Time: 11/18/17  1:14 PM   Result Value Ref Range    Epithelial cells, squamous TNTC /LPF    WBC 30-49 /HPF    RBC 30-49 /HPF    Mucus PRESENT      Bacteria 2+ /HPF   POC HCG,URINE    Collection Time: 11/18/17  1:16 PM   Result Value Ref Range    HCG urine, QL negative NEGATIVE,Negative,negative     WET PREP    Collection Time: 11/18/17  1:30 PM   Result Value Ref Range    Wet prep        No Yeast or motile Trichomonas seen  Clue Cells Seen     CT/GC DNA    Collection Time: 11/18/17  1:30 PM   Result Value Ref Range    CHLAMYDIA TRACHOMATIS DNA NOT DETECTED NOT DETECTED      NEISSERIA GONORRHOEAE DNA DETECTED (A) NOT DETECTED       Labs Reviewed - No data to display    Imaging:    No results found.    ED Course/Medical Decision Making   Patient's chart from this morning was reviewed. Patient had no tenderness on pelvic exam. I do not feel she needs to be treated for PID at this time.   Patient was given rocephin IM and instructed to follow up with the health department for further STD testing. She was instructed to have any partners treated and refrain from sexual contact for 7 days after both partners finish treatment.     Patient did not develop any new or worsening symptoms and has remained clinically stable throughout their  course in the ED. I feel the patient is stable for discharge at this time to continue outpatient management and follow up with primary care. Patient was instructed to return to the ER if condition worsens or new symptoms develop. The patient indicates understanding of these issues and agrees with the plan.       Medications    cefTRIAXone (ROCEPHIN) 250 mg in lidocaine (PF) (XYLOCAINE) 10 mg/mL (1 %) IM injection (250 mg IntraMUSCular Given 11/18/17 2029)        Final Diagnosis       ICD-10-CM ICD-9-CM   1. Gonorrhea A54.9 098.0       Disposition   Discharged in stable condition.    Discharge Medication List as of 11/18/2017  8:53 PM          Patient has been evaluated by myself and  [facility], SARA N, MD who agrees with the above assessment and plan.    Caren Griffins, PA-C  November 19, 2017  8:52 PM      My signature above authenticates this document and my orders, the final ??  diagnosis (es), discharge prescription (s), and instructions in the Epic ??  record.  If you have any questions please contact [phone].  ??  Nursing notes have been reviewed by the physician/ advanced practice ??  Clinician.

## 2017-11-18 NOTE — ED Triage Notes (Signed)
Pt presents with c/o chest tightness d/t cough & requesting "to be checked for STDs". Stated she just noticed the cough, chest tightness began last night. Was told she has asthma & was given an inhaler; ran out & needs to be evaluated.

## 2017-11-18 NOTE — ED Notes (Signed)
9:00 PM  11/18/17     Discharge instructions given to sharadye (name) with verbalization of understanding. Patient accompanied by family.  Patient discharged with the following prescriptions none. Patient discharged to home (destination).      Chad CordialSummer Carmichael, [place]

## 2017-11-18 NOTE — ED Provider Notes (Signed)
[facility] Care  Emergency Department Treatment Report    Patient: [name] Allen Age: 22 y.o. Sex: female    Date of Birth: 02-09-1996 Admit Date: 11/18/2017 PCP: Alfred Levins, MD   MRN: [id number]  CSN: [id number]  ATTENDING: Marijo Sanes, MD   Room: ER36/ER36 Time Dictated: 1:47 PM APP: Merian Capron, PA-C     Chief Complaint   Chief Complaint   Patient presents with   ??? Exposure to STD   ??? Cough       History of Present Illness   22 y.o. female with a history of asthma presenting to the ED for evaluation of cough for the past 2-3 days, non-productive, associated with some chest tightness when she coughs, and also requesting to be tested for STDs.  She reports that she regularly has unprotected sex, denies any known contacts, reports that she always has some vaginal discharge but this is not new for her, she is unsure of the color because she doesn't look at it, denies rashes or vaginal irritation. Has been treated for STDs in the past but is unsure what she was treated for.  She does endorse mild dysuria but no frequency. LMP was 10/06/17.  She has an inhaler but ran out of it, has not been back to her primary care doctor in several years. She denies fevers, chills, body aches, nasal congestion or sore throat.  Denies shortness of breath.    Review of Systems   Constitutional:  No fever, chills, body aches  ENT: No sore throat or runny nose  Respiratory: +cough. No shortness of breath, or wheezing  Cardiovascular: No chest pain or palpitations  Gastrointestinal: No abdominal pain, nausea, vomiting, diarrhea, or constipation  Genitourinary: +dysuria. No frequency.  Integumentary: No rashes or lesions  Neurological: No headache or dizziness.    Past Medical/Surgical History   History reviewed. No pertinent past medical history.  History reviewed. No pertinent surgical history.    Social History     Social History     Socioeconomic History   ??? Marital status: SINGLE      Spouse name: Not on file   ??? Number of children: Not on file   ??? Years of education: Not on file   ??? Highest education level: Not on file   Tobacco Use   ??? Smoking status: Current Every Day Smoker   ??? Smokeless tobacco: Never Used   Substance and Sexual Activity   ??? Alcohol use: Yes   ??? Drug use: No   ??? Sexual activity: Yes     Partners: Male     Birth control/protection: Condom     Smokes 10 black and mild cigarettes per day.  Family History     Family History   Problem Relation Age of Onset   ??? Diabetes Maternal Grandmother    ??? Diabetes Paternal Grandfather        Current Medications     Prior to Admission Medications   Prescriptions Last Dose Informant Patient Reported? Taking?   ibuprofen (MOTRIN) 800 mg tablet   No No   Sig: Take 1 Tab by mouth every eight (8) hours as needed for Pain (and inflammation.).      Facility-Administered Medications: None       Allergies   No Known Allergies    Physical Exam     ED Triage Vitals [11/18/17 1255]   ED Encounter Vitals Group      BP 114/75      Pulse (  Heart Rate) 96      Resp Rate 18      Temp 99.1 ??F (37.3 ??C)      Temp src       O2 Sat (%) 100 %      Weight 110 lb      Height 5\' 4"      Constitutional: Patient appears well developed and well nourished. Appearance and behavior are age and situation appropriate.  HEENT: Conjunctiva clear.  PERRL. Mucous membranes moist, non-erythematous. Surface of the pharynx, palate, and tongue are pink, moist and without lesions.  Neck: supple, non tender, symmetrical, no lymphadenopathy   Respiratory: lungs clear to auscultation, nonlabored respirations. No tachypnea or accessory muscle use.  Cardiovascular: heart regular rate and rhythm without murmur rubs or gallops.     Gastrointestinal:  Soft, non-tender, non-distended, normoactive bowel sounds  Musculoskeletal: Calves soft and non-tender. No peripheral edema.  Pulses:  Radial and DP pulses 2+ and equal bilaterally.    Integumentary: warm and dry, no jaundice, no rashes or lesions  Genitalia: External genitalia without swelling, lesions, or discharge. Vaginal walls have no bulging or lesions. Cervix pink with no lesions or discharge noted. No pain on cervical motion. Uterus movable without mass or tenderness. No adnexal masses noted, no adnexal tenderness.      Impression and Management Plan   22 year old female with a history of asthma presenting cough over the past 2-3 days and also to be checked for STDs.  On exam, her vitals are all within normal limits, O2 saturation 100% on back, there is no tachypnea, she is afebrile and well-appearing.  Lungs are clear to auscultation bilaterally.  Pelvic exam unremarkable.  Will obtain urinalysis, urine pregnancy test, I have sent samples for wet prep and GC/chlamydia testing.  Do not believe any imaging of the chest is warranted at this time.    Diagnostic Studies   Lab:   Recent Results (from the past 12 hour(s))   POC URINE MACROSCOPIC    Collection Time: 11/18/17  1:14 PM   Result Value Ref Range    Glucose Negative NEGATIVE,Negative mg/dl    Bilirubin Negative NEGATIVE,Negative      Ketone Negative NEGATIVE,Negative mg/dl    Specific gravity >=9.147>=1.030 1.005 - 1.030      Blood Trace-intact (A) NEGATIVE,Negative      pH (UA) 6.0 5 - 9      Protein Trace (A) NEGATIVE,Negative mg/dl    Urobilinogen 0.2 0.0 - 1.0 EU/dl    Nitrites Negative NEGATIVE,Negative      Leukocyte Esterase Small (A) NEGATIVE,Negative      Color Yellow      Appearance Slightly Cloudy     POC URINE MICROSCOPIC    Collection Time: 11/18/17  1:14 PM   Result Value Ref Range    Epithelial cells, squamous TNTC /LPF    WBC 30-49 /HPF    RBC 30-49 /HPF    Mucus PRESENT      Bacteria 2+ /HPF   POC HCG,URINE    Collection Time: 11/18/17  1:16 PM   Result Value Ref Range    HCG urine, QL negative NEGATIVE,Negative,negative     WET PREP    Collection Time: 11/18/17  1:30 PM   Result Value Ref Range    Wet prep         No Yeast or motile Trichomonas seen  Clue Cells Seen     CT/GC DNA    Collection Time: 11/18/17  1:30 PM   Result  Value Ref Range    CHLAMYDIA TRACHOMATIS DNA NOT DETECTED NOT DETECTED      NEISSERIA GONORRHOEAE DNA DETECTED (A) NOT DETECTED       Imaging:    No results found.      ED Course/Medical Decision Making   Patient's urinalysis with 2+ bacteria, small leukocyte esterase and trace intact blood, however epithelial cells were too numerous to count.  This has been sent for culture.  She is not pregnant.  Wet prep reveals clue cells consistent with bacterial vaginosis.  GC and Chlamydia were pending at time of discharge.  Pelvic exam without mucopurulent discharge from the cervix, no abnormal findings.  Her lungs are clear bilaterally she is afebrile.  I refilled her prescription for albuterol inhaler and provided her with a prescription for Flagyl for her BV, informed her that she would receive a phone call if her GC and chlamydia were positive.  Encouraged her to follow-up with the health department for testing for syphilis and HIV.  Likewise instructed her to follow-up with her primary care doctor.  She was stable throughout her time in the ED and was discharged home in good condition.    Final Diagnosis       ICD-10-CM ICD-9-CM   1. BV (bacterial vaginosis) N76.0 616.10    B96.89 041.9   2. Cough R05 786.2   3. Possible exposure to STD Z20.2 V01.6       Disposition   Discharged home.     Chenika Nevils, PA-C  November 18, 2017    My signature above authenticates this document and my orders, the final diagnosis (es), discharge prescription (s), and instructions in the Epic record. If you have any questions please contact [phone].  ??  Nursing notes have been reviewed by the physician/ advanced practice clinician.

## 2017-11-18 NOTE — ED Notes (Signed)
2:58 PM  11/18/17     Discharge instructions given to Palmer (name) with verbalization of understanding. Patient accompanied by patient.  Patient discharged with the following prescriptions   Current Discharge Medication List      START taking these medications    Details   metroNIDAZOLE (FLAGYL) 500 mg tablet Take 1 Tab by mouth two (2) times a day for 7 days.  Qty: 14 Tab, Refills: 0      albuterol sulfate 90 mcg/actuation aepb Take 2 Puffs by inhalation every four (4) hours as needed.  Qty: 1 Inhaler, Refills: 0          . Patient discharged to Home (destination).      Sharon MtBrittany E Stephenson, RN

## 2017-11-18 NOTE — ED Triage Notes (Signed)
Pt states she was seen in ED earlier today and was called by call back nurse to come back and get a shot and a pill to be treated for a positive STD test.

## 2017-11-18 NOTE — Other (Signed)
LAB    Lab results and medications have been reviewed, no further action is needed at this time.

## 2017-11-19 ENCOUNTER — Inpatient Hospital Stay
Admit: 2017-11-19 | Discharge: 2017-11-19 | Disposition: A | Discharge status: Home / Self Care | Payer: PRIVATE HEALTH INSURANCE | Attending: Emergency Medicine

## 2017-11-19 LAB — CULTURE, URINE
CULTURE RESULT: NO GROWTH
Culture result: NO GROWTH

## 2017-11-19 MED ORDER — LIDOCAINE (PF) 10 MG/ML (1 %) IJ SOLN
101 mg/mL (1 %) | Freq: Once | INTRAMUSCULAR | Status: AC
Start: 2017-11-19 — End: 2017-11-18
  Administered 2017-11-19: 01:00:00 via INTRAMUSCULAR

## 2017-11-19 MED FILL — CEFTRIAXONE 250 MG SOLUTION FOR INJECTION: 250 mg | INTRAMUSCULAR | Qty: 250

## 2018-04-16 ENCOUNTER — Inpatient Hospital Stay
Admit: 2018-04-16 | Discharge: 2018-04-16 | Disposition: A | Discharge status: Home / Self Care | Payer: PRIVATE HEALTH INSURANCE | Attending: Emergency Medicine

## 2018-04-16 ENCOUNTER — Inpatient Hospital Stay: Admit: 2018-04-16 | Payer: PRIVATE HEALTH INSURANCE | Primary: Pediatrics

## 2018-04-16 DIAGNOSIS — N760 Acute vaginitis: Secondary | ICD-10-CM

## 2018-04-16 DIAGNOSIS — A549 Gonococcal infection, unspecified: Secondary | ICD-10-CM

## 2018-04-16 LAB — POC URINE MACROSCOPIC
Bilirubin, Urine: NEGATIVE
Bilirubin: NEGATIVE
Blood, Urine: NEGATIVE
Blood: NEGATIVE
Glucose, Ur: NEGATIVE mg/dl
Glucose: NEGATIVE mg/dl
Ketone: NEGATIVE mg/dl
Ketones, Urine: NEGATIVE mg/dl
Nitrite, Urine: NEGATIVE
Nitrites: NEGATIVE
Protein, UA: NEGATIVE mg/dl
Protein: NEGATIVE mg/dl
Specific Gravity, UA: 1.02 (ref 1.005–1.030)
Specific gravity: 1.02 (ref 1.005–1.030)
Urobilinogen, UA, POCT: 0.2 EU/dl (ref 0.0–1.0)
Urobilinogen: 0.2 EU/dl (ref 0.0–1.0)
pH (UA): 6 (ref 5–9)
pH, UA: 6 (ref 5–9)

## 2018-04-16 LAB — POC URINE MICROSCOPIC

## 2018-04-16 LAB — POC HCG,URINE
HCG urine, QL: NEGATIVE
Pregnancy Test(Urn): NEGATIVE

## 2018-04-16 LAB — CT/GC DNA
CHLAMYDIA TRACHOMATIS DNA, CTDNA: NOT DETECTED
CHLAMYDIA TRACHOMATIS DNA: NOT DETECTED
NEISSERIA GONORRHOEAE DNA, GCDNA: DETECTED — AB
NEISSERIA GONORRHOEAE DNA: DETECTED — AB

## 2018-04-16 LAB — WET PREP
Wet Prep: NONE SEEN
Wet prep: NONE SEEN

## 2018-04-16 MED ORDER — CEFTRIAXONE 250 MG SOLUTION FOR INJECTION
250 mg | Freq: Once | INTRAMUSCULAR | 0 refills | Status: AC
Start: 2018-04-16 — End: 2018-04-16

## 2018-04-16 MED ORDER — CEFTRIAXONE 250 MG SOLUTION FOR INJECTION
250 mg | Freq: Once | INTRAMUSCULAR | Status: AC
Start: 2018-04-16 — End: 2018-04-16
  Administered 2018-04-16: 20:00:00 via INTRAMUSCULAR

## 2018-04-16 MED ORDER — METRONIDAZOLE 500 MG TAB
500 mg | ORAL_TABLET | Freq: Two times a day (BID) | ORAL | 0 refills | Status: AC
Start: 2018-04-16 — End: 2018-04-23

## 2018-04-16 MED FILL — CEFTRIAXONE 250 MG SOLUTION FOR INJECTION: 250 mg | INTRAMUSCULAR | Qty: 250

## 2018-04-16 NOTE — ED Triage Notes (Signed)
Patient complains of change in her discharge she noticed last week, history of BV, white discharge with some odor.

## 2018-04-16 NOTE — ED Provider Notes (Signed)
[facility]  Emergency Department Treatment Report        Patient: [name] Allen Age: 22 y.o. Sex: female    Date of Birth: 06/24/1996 Admit Date: 04/16/2018 PCP: Alfred Levinsurry, Michelle T, MD   MRN: [id number]  CSN: [id number]  Attending: Konrad FelixSIEGEL, LEWIS H, MD   Room: OTF/OTF Time Dictated: 12:23 PM APP: Fonnie MuKate Ryott Rafferty, PA-C     Chief Complaint   Chief Complaint   Patient presents with   ??? Vaginal Discharge       History of Present Illness   22 y.o. female otherwise healthy presenting for evaluation of vaginal discharge x2 days.  Patient reports she has had similar symptoms in the past and has had bacterial vaginosis, this feels similar.  She notes some vaginal irritation and itching, has noted a malodorous fishy discharge.  She denies dysuria, no hematuria or irritative voiding symptoms.  No fevers or chills, she denies any pelvic pain or vomiting.  She states she has had unprotected intercourse with her partner recently, she has no specific concerns for STDs or known exposures.    Review of Systems   Review of Systems   Constitutional: Negative for chills and fever.   HENT: Negative for sore throat.    Eyes: Negative for blurred vision and double vision.   Respiratory: Negative for shortness of breath.    Cardiovascular: Negative for chest pain.   Gastrointestinal: Negative for abdominal pain, diarrhea, nausea and vomiting.   Genitourinary: Negative for dysuria, frequency, hematuria and urgency.        Vaginal discharge   Musculoskeletal: Negative for joint pain and myalgias.   Skin: Negative for rash.   Neurological: Negative for sensory change and focal weakness.       Past Medical/Surgical History   History reviewed. No pertinent past medical history.     History reviewed. No pertinent surgical history.    Social History     Social History     Socioeconomic History   ??? Marital status: SINGLE     Spouse name: Not on file   ??? Number of children: Not on file   ??? Years of education: Not on file    ??? Highest education level: Not on file   Occupational History   ??? Not on file   Social Needs   ??? Financial resource strain: Not on file   ??? Food insecurity:     Worry: Not on file     Inability: Not on file   ??? Transportation needs:     Medical: Not on file     Non-medical: Not on file   Tobacco Use   ??? Smoking status: Current Every Day Smoker   ??? Smokeless tobacco: Never Used   Substance and Sexual Activity   ??? Alcohol use: Yes   ??? Drug use: No   ??? Sexual activity: Yes     Partners: Male     Birth control/protection: Condom   Lifestyle   ??? Physical activity:     Days per week: Not on file     Minutes per session: Not on file   ??? Stress: Not on file   Relationships   ??? Social connections:     Talks on phone: Not on file     Gets together: Not on file     Attends religious service: Not on file     Active member of club or organization: Not on file     Attends meetings of clubs or organizations:  Not on file     Relationship status: Not on file   ??? Intimate partner violence:     Fear of current or ex partner: Not on file     Emotionally abused: Not on file     Physically abused: Not on file     Forced sexual activity: Not on file   Other Topics Concern   ??? Not on file   Social History Narrative   ??? Not on file       Family History     Family History   Problem Relation Age of Onset   ??? Diabetes Maternal Grandmother    ??? Diabetes Paternal Grandfather        Current Medications     (Not in a hospital admission)  None     Allergies   No Known Allergies    Physical Exam     ED Triage Vitals   ED Encounter Vitals Group      BP 04/16/18 1025 128/62      Pulse (Heart Rate) 04/16/18 1025 87      Resp Rate 04/16/18 1025 16      Temp 04/16/18 1025 98.9 ??F (37.2 ??C)      Temp src --       O2 Sat (%) 04/16/18 1025 99 %      Weight 04/16/18 1039 115 lb      Height 04/16/18 1039 5\' 4"      Physical Exam   Constitutional: She is oriented to person, place, and time.   Well-appearing female sitting the stretcher, no acute distress    HENT:   Head: Normocephalic and atraumatic.   Mouth/Throat: Oropharynx is clear and moist.   Eyes: Conjunctivae are normal.   Neck: Normal range of motion. Neck supple.   Cardiovascular: Normal rate.   Pulmonary/Chest: Effort normal.   Abdominal: Soft. She exhibits no distension. There is no tenderness. There is no rebound and no guarding.   Genitourinary: Uterus normal, cervix normal, right adnexa normal and left adnexa normal. Vaginal discharge found.   Genitourinary Comments: Normal-appearing external vagina, no lesions or ulcerations  There is some white vaginal discharge noted, no signs of cervicitis, she has no cervical motion tenderness, no adnexal tenderness   Musculoskeletal: Normal range of motion.   Neurological: She is alert and oriented to person, place, and time.   Skin: Skin is warm and dry.   Nursing note and vitals reviewed.       Impression and Management Plan   22 year old female presenting for evaluation of vaginal discharge x2 days, she reports is similar to bacterial vaginosis that she has had in the past.  She appears well, afebrile.  Abdomen is soft and nontender.  We will obtain urinalysis, rule out pregnancy or infection.  Perform pelvic exam and obtain appropriate specimens for wet prep and chlamydia/gonorrhea cultures.    Diagnostic Studies   Lab:   Recent Results (from the past 12 hour(s))   POC URINE MACROSCOPIC    Collection Time: 04/16/18 10:51 AM   Result Value Ref Range    Glucose Negative NEGATIVE,Negative mg/dl    Bilirubin Negative NEGATIVE,Negative      Ketone Negative NEGATIVE,Negative mg/dl    Specific gravity 1.610 1.005 - 1.030      Blood Negative NEGATIVE,Negative      pH (UA) 6.0 5 - 9      Protein Negative NEGATIVE,Negative mg/dl    Urobilinogen 0.2 0.0 - 1.0 EU/dl    Nitrites Negative NEGATIVE,Negative  Leukocyte Esterase Trace (A) NEGATIVE,Negative      Color Yellow      Appearance Slightly Cloudy     POC URINE MICROSCOPIC    Collection Time: 04/16/18 10:51 AM    Result Value Ref Range    Epithelial cells, squamous 5-9 /LPF    WBC OCCASIONAL /HPF   POC HCG,URINE    Collection Time: 04/16/18 10:53 AM   Result Value Ref Range    HCG urine, QL negative NEGATIVE,Negative,negative     WET PREP    Collection Time: 04/16/18 11:16 AM   Result Value Ref Range    Wet prep        No Yeast or motile Trichomonas seen  Clue Cells Seen       Labs Reviewed   POC URINE MACROSCOPIC - Abnormal; Notable for the following components:       Result Value    Leukocyte Esterase Trace (*)     All other components within normal limits   WET PREP   CT/GC DNA   POC URINE MICROSCOPIC   POC HCG,URINE       Imaging:    No results found.    ED Course/Medical Decision Making   Patient is not pregnant.  Urinalysis without signs of infection.  Her pelvic exam was unremarkable, no signs of PID, TOA or torsion. Her wet prep did have clue cells indicative of bacterial vaginosis.  Her chlamydia and gonorrhea are pending. I discussed these findings with the patient, she will be discharged home with Flagyl.  She will be contacted with the results of her chlamydia and gonorrhea positive for appropriate treatment. I did encourage her to go to the health department for full STD testing including blood work.  She should return here for new or worsening symptoms such as fever, pelvic pain or other concerns.  Patient expressed understanding, she is in agreement with this plan.     Medications - No data to display  Final Diagnosis       ICD-10-CM ICD-9-CM   1. BV (bacterial vaginosis) N76.0 616.10    B96.89 041.9   2. Vaginal discharge N89.8 623.5       Disposition   Home in stable condition       Discharge Medication List as of 04/16/2018 12:26 PM      START taking these medications    Details   metroNIDAZOLE (FLAGYL) 500 mg tablet Take 1 Tab by mouth two (2) times a day for 7 days., Print, Disp-14 Tab, R-0           Fonnie Mu, PA-C  April 16, 2018  12:23 PM     The patient was personally evaluated by myself and discussed with Dr. Henrene Hawking, Janett Billow, MD who agrees with the above assessment and plan.  Please note that portions of this note were completed with a voice recognition program. Efforts were made to edit the dictations but occasionally words are mis-transcribed.   My signature above authenticates this document and my orders, the final ??  diagnosis (es), discharge prescription (s), and instructions in the Epic ??  record.  If you have any questions please contact [phone].  ??  Nursing notes have been reviewed by the physician/ advanced practice ??  Clinician.

## 2018-04-16 NOTE — Other (Addendum)
LAB- Called patient and informed her that her STD results came back positive for GONORRHOEAE, and that there was a prescription was here for her. She stated that she would be coming up to get her prescription and to receive her injection. She was prescribed Rocephin by Fonnie MuKate Digeronimo.        CHLAMYDIA TRACHOMATIS DNA NOT DETECTED   NOT DETECTED ?? Final   Comment:   Testing for Chlamydia trachomatis and Neisseria gonorrhoeae using Cepheid's CTNG by PCR has not   been evaluated on patients less than 21 years of age, pregnancy or patients who have had a   hysterectomy.   Results should be interpreted with caution and any positive results for these patients listed   must be confirmed by an alternate method to validate the results.    NEISSERIA GONORRHOEAE DNA DETECTED  Abnormal   NOT DETECTED ?? Final

## 2018-04-16 NOTE — ED Notes (Signed)
12:47 PM  04/16/18     Discharge instructions given to patient (name) with verbalization of understanding. Patient accompanied by self.  Patient discharged with the following prescriptions flagyl. Patient discharged to home (destination).      Michele Lantry, RN

## 2018-04-16 NOTE — ED Provider Notes (Signed)
ED Provider Notes by Mel Almondigeronimo, Waylon Hershey E, PA-C at 04/16/18 1223                Author: Zena Amosigeronimo, Gabriel RungKate E, PA-C  Service: EMERGENCY  Author Type: Physician Assistant       Filed: 04/16/18 1325  Date of Service: 04/16/18 1223  Status: Attested           Editor: Eliyah Mcshea, Gabriel RungKate E, PA-C (Physician Assistant)  Cosigner: Konrad FelixSiegel, Lewis H, MD at 04/20/18 2258          Attestation signed by Konrad FelixSiegel, Lewis H, MD at 04/20/18 2258           I have discussed this case with the advanced practice provider. We have reviewed the presentation, pertinent historical and physical exam findings, as well  as relevant laboratory/radiology findings. I have been available at all times and agree with the management and disposition documented here.      Konrad FelixLewis H Siegel, MD's                                 [facility]   Emergency Department Treatment Report                Patient: [name] QuitterShardaye R Pudlo  Age: 22 y.o.  Sex: female          Date of Birth: 07/22/1996  Admit Date: 04/16/2018  PCP: Alfred Levinsurry, Michelle T, MD     MRN: [id number]   CSN: [id number]   Attending: Konrad FelixSIEGEL, LEWIS H, MD         Room: OTF/OTF  Time Dictated: 12:23 PM  APP: Fonnie MuKate Bradee Common, PA-C        Chief Complaint      Chief Complaint       Patient presents with        ?  Vaginal Discharge             History of Present Illness     22 y.o. female  otherwise healthy presenting for evaluation of vaginal discharge x2 days.  Patient reports she has had similar symptoms in the past and has had bacterial vaginosis, this feels similar.  She notes some vaginal irritation and itching, has noted a malodorous  fishy discharge.  She denies dysuria, no hematuria or irritative voiding symptoms.  No fevers or chills, she denies any pelvic pain or vomiting.  She states she has had unprotected intercourse with her partner recently, she has no specific concerns for  STDs or known exposures.        Review of Systems     Review of Systems    Constitutional: Negative for chills  and fever.    HENT: Negative for sore throat.     Eyes: Negative for blurred vision and double vision.    Respiratory: Negative for shortness of breath.     Cardiovascular: Negative for chest pain.    Gastrointestinal: Negative for abdominal pain, diarrhea, nausea and vomiting.    Genitourinary: Negative for dysuria, frequency, hematuria and urgency.         Vaginal discharge    Musculoskeletal: Negative for joint pain and myalgias.    Skin: Negative for rash.    Neurological: Negative for sensory change and focal weakness.            Past Medical/Surgical History     History reviewed. No pertinent past medical history.       History  reviewed. No pertinent surgical history.        Social History          Social History          Socioeconomic History         ?  Marital status:  SINGLE              Spouse name:  Not on file         ?  Number of children:  Not on file     ?  Years of education:  Not on file     ?  Highest education level:  Not on file       Occupational History        ?  Not on file       Social Needs         ?  Financial resource strain:  Not on file        ?  Food insecurity:              Worry:  Not on file         Inability:  Not on file        ?  Transportation needs:              Medical:  Not on file         Non-medical:  Not on file       Tobacco Use         ?  Smoking status:  Current Every Day Smoker     ?  Smokeless tobacco:  Never Used       Substance and Sexual Activity         ?  Alcohol use:  Yes     ?  Drug use:  No     ?  Sexual activity:  Yes              Partners:  Male         Birth control/protection:  Condom       Lifestyle        ?  Physical activity:              Days per week:  Not on file         Minutes per session:  Not on file         ?  Stress:  Not on file       Relationships        ?  Social connections:              Talks on phone:  Not on file         Gets together:  Not on file         Attends religious service:  Not on file         Active member of club or  organization:  Not on file         Attends meetings of clubs or organizations:  Not on file         Relationship status:  Not on file        ?  Intimate partner violence:              Fear of current or ex partner:  Not on file         Emotionally abused:  Not on file         Physically abused:  Not on file  Forced sexual activity:  Not on file        Other Topics  Concern        ?  Not on file       Social History Narrative        ?  Not on file             Family History          Family History         Problem  Relation  Age of Onset          ?  Diabetes  Maternal Grandmother            ?  Diabetes  Paternal Grandfather               Current Medications        (Not in a hospital admission)     None          Allergies     No Known Allergies        Physical Exam          ED Triage Vitals     ED Encounter Vitals Group            BP  04/16/18 1025  128/62        Pulse (Heart Rate)  04/16/18 1025  87        Resp Rate  04/16/18 1025  16        Temp  04/16/18 1025  98.9 ??F (37.2 ??C)        Temp src  --          O2 Sat (%)  04/16/18 1025  99 %        Weight  04/16/18 1039  115 lb            Height  04/16/18 1039  5\' 4"         Physical Exam    Constitutional: She is oriented to person, place, and time.   Well-appearing female sitting the stretcher, no acute distress    HENT:    Head: Normocephalic and atraumatic.    Mouth/Throat: Oropharynx is clear and moist.    Eyes: Conjunctivae are normal.    Neck: Normal range of motion. Neck supple.    Cardiovascular: Normal rate.    Pulmonary/Chest: Effort normal.    Abdominal: Soft. She exhibits no distension. There is no tenderness. There is no rebound and no guarding.   Genitourinary:  Uterus normal, cervix normal, right adnexa normal and left adnexa normal. Vaginal discharge found.   Genitourinary Comments:  Normal-appearing external vagina, no lesions or ulcerations  There is some white vaginal discharge noted, no signs of cervicitis, she has no cervical motion  tenderness, no adnexal tenderness    Musculoskeletal: Normal range of motion.    Neurological: She is alert and oriented to person, place, and time.    Skin: Skin is warm and dry.    Nursing note and vitals reviewed.            Impression and Management Plan     22 year old female presenting for evaluation of vaginal discharge x2 days, she reports is similar to bacterial vaginosis that she has had in the past.  She appears  well, afebrile.  Abdomen is soft and nontender.  We will obtain urinalysis, rule out pregnancy or infection.  Perform pelvic exam and obtain appropriate specimens for wet prep and chlamydia/gonorrhea cultures.  Diagnostic Studies     Lab:      Recent Results (from the past 12 hour(s))     POC URINE MACROSCOPIC          Collection Time: 04/16/18 10:51 AM         Result  Value  Ref Range            Glucose  Negative  NEGATIVE,Negative mg/dl       Bilirubin  Negative  NEGATIVE,Negative         Ketone  Negative  NEGATIVE,Negative mg/dl       Specific gravity  1.020  1.005 - 1.030         Blood  Negative  NEGATIVE,Negative         pH (UA)  6.0  5 - 9         Protein  Negative  NEGATIVE,Negative mg/dl       Urobilinogen  0.2  0.0 - 1.0 EU/dl       Nitrites  Negative  NEGATIVE,Negative         Leukocyte Esterase  Trace (A)  NEGATIVE,Negative         Color  Yellow          Appearance  Slightly Cloudy          POC URINE MICROSCOPIC          Collection Time: 04/16/18 10:51 AM         Result  Value  Ref Range            Epithelial cells, squamous  5-9  /LPF       WBC  OCCASIONAL  /HPF       POC HCG,URINE          Collection Time: 04/16/18 10:53 AM         Result  Value  Ref Range            HCG urine, QL  negative  NEGATIVE,Negative,negative         WET PREP          Collection Time: 04/16/18 11:16 AM         Result  Value  Ref Range            Wet prep                  No Yeast or motile Trichomonas seen   Clue Cells Seen             Labs Reviewed       POC URINE MACROSCOPIC - Abnormal; Notable for  the following components:            Result  Value            Leukocyte Esterase  Trace (*)            All other components within normal limits       WET PREP     CT/GC DNA     POC URINE MICROSCOPIC       POC HCG,URINE           Imaging:     No results found.        ED Course/Medical Decision Making     Patient is not pregnant.  Urinalysis without signs of infection.  Her pelvic exam was unremarkable, no signs of PID, TOA or torsion. Her wet prep did have clue cells  indicative of bacterial vaginosis.  Her chlamydia and gonorrhea are  pending. I discussed these findings with the patient, she will be discharged home with Flagyl.  She will be contacted with the results of her chlamydia and gonorrhea positive for appropriate  treatment. I did encourage her to go to the health department for full STD testing including blood work.  She should return here for new or worsening symptoms such as fever, pelvic pain or other concerns.  Patient expressed understanding, she is in agreement  with this plan.       Medications - No data to display     Final Diagnosis                 ICD-10-CM  ICD-9-CM          1.  BV (bacterial vaginosis)  N76.0  616.10           B96.89  041.9          2.  Vaginal discharge  N89.8  623.5             Disposition     Home in stable condition            Discharge Medication List as of 04/16/2018 12:26 PM              START taking these medications          Details        metroNIDAZOLE (FLAGYL) 500 mg tablet  Take 1 Tab by mouth two (2) times a day for 7 days., Print, Disp-14 Tab, R-0                      Fonnie Mu, PA-C   April 16, 2018   12:23 PM      The patient was personally evaluated by myself and discussed with Dr. Henrene Hawking, Janett Billow, MD who agrees with the above assessment and plan.   Please note that portions of this note were completed with a voice recognition program. Efforts were made to edit the dictations but occasionally words are mis-transcribed.    My signature above authenticates  this document and my orders, the final ??   diagnosis (es), discharge prescription (s), and instructions in the Epic ??   record.   If you have any questions please contact [phone].   ??   Nursing notes have been reviewed by the physician/ advanced practice ??   Clinician.

## 2018-04-16 NOTE — ED Notes (Signed)
12:47 PM  04/16/18     Discharge instructions given to patient (name) with verbalization of understanding. Patient accompanied by self.  Patient discharged with the following prescriptions flagyl. Patient discharged to home (destination).      Trecia Rogers, RN

## 2018-04-16 NOTE — ED Notes (Signed)
Patient complains of change in her discharge she noticed last week, history of BV, white discharge with some odor.

## 2018-07-02 IMAGING — US US PELVIS COMPLETE
1 series · 13 of 25 positions shown · non-contrast
Comparison: None

CLINICAL DATA: Pelvic cramping and irregular menstrual bleeding.
IUD placed approximately 4 months ago.

EXAM:
TRANSABDOMINAL AND TRANSVAGINAL ULTRASOUND OF PELVIS
TECHNIQUE: Both transabdominal and transvaginal ultrasound examinations of the
pelvis were performed. Transabdominal technique was performed for
global imaging of the pelvis including uterus, ovaries, adnexal
regions, and pelvic cul-de-sac. It was necessary to proceed with
endovaginal exam following the transabdominal exam to visualize the
uterus and ovaries.

[Series 1: us pelvis complete · 0.17mm/px · 13 of 108 slices shown]
[im 1/108]
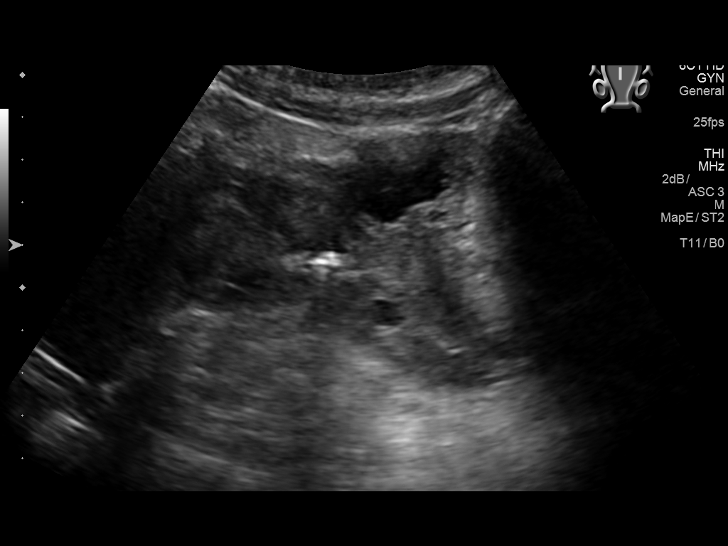
[im 9/108]
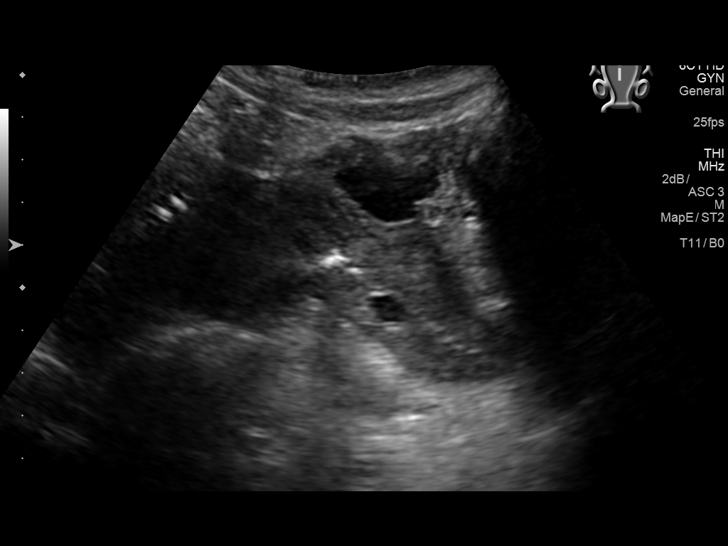
[im 18/108]
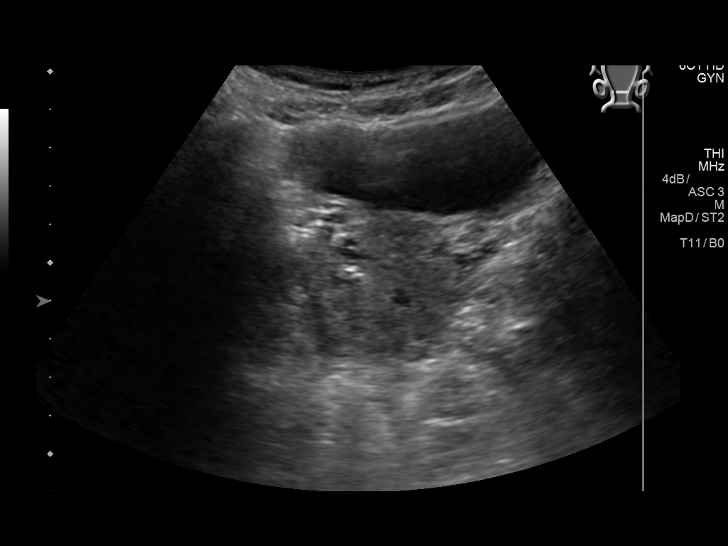
[im 27/108]
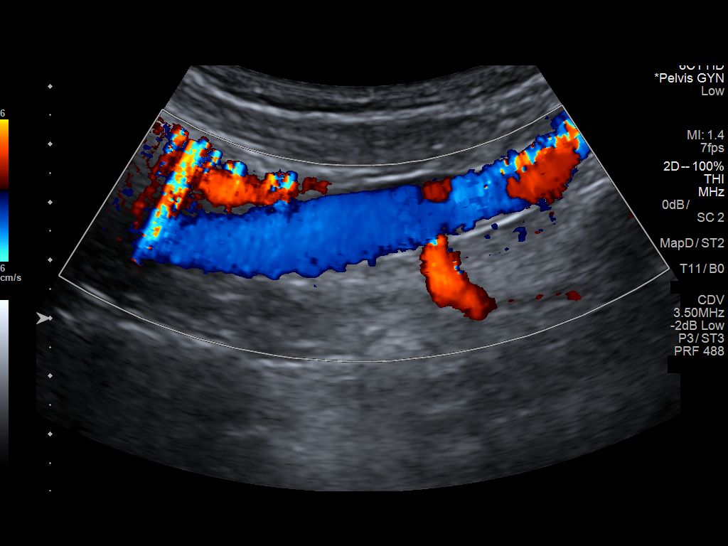
[im 36/108]
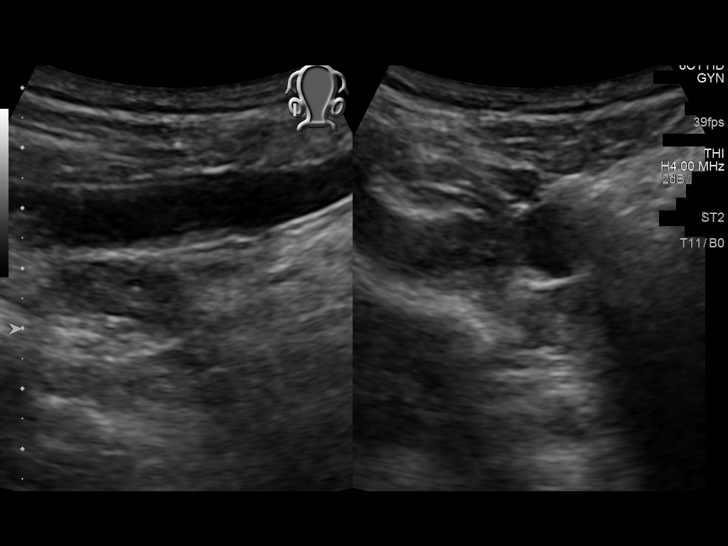
[im 45/108]
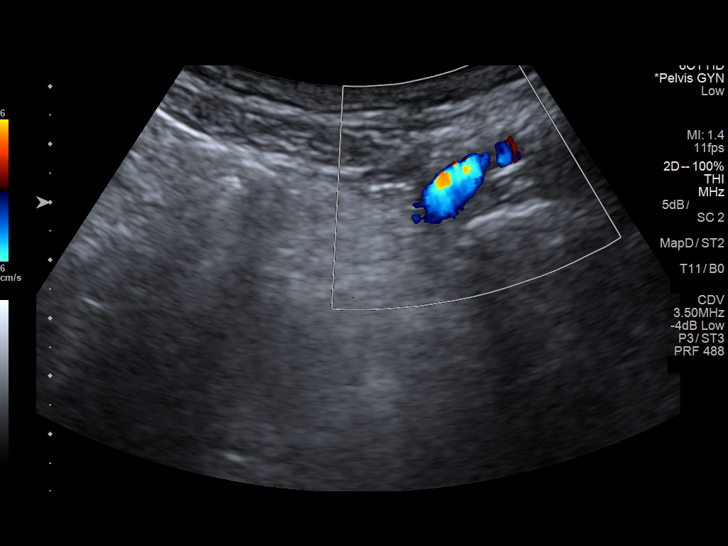
[im 54/108]
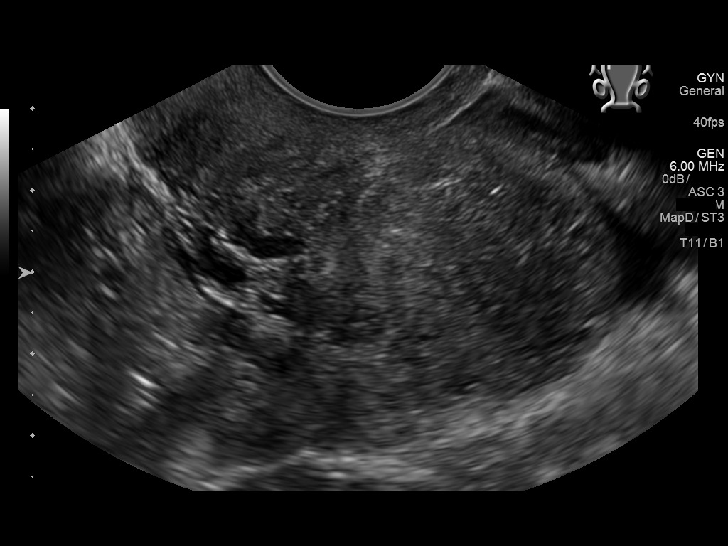
[im 63/108]
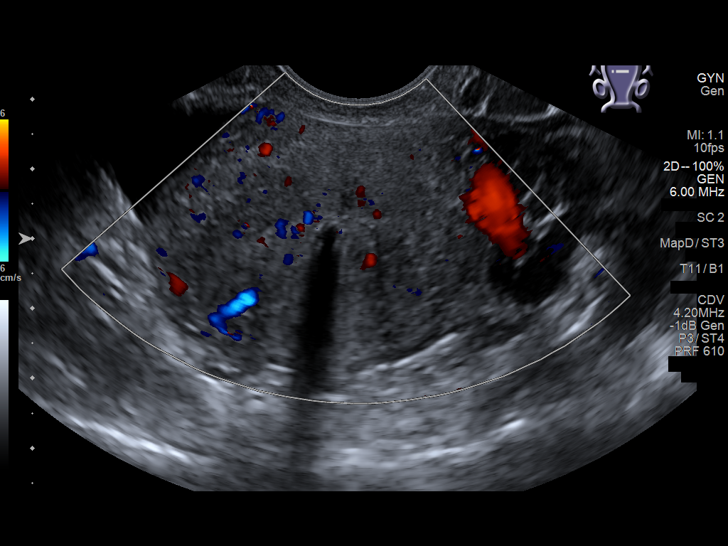
[im 72/108]
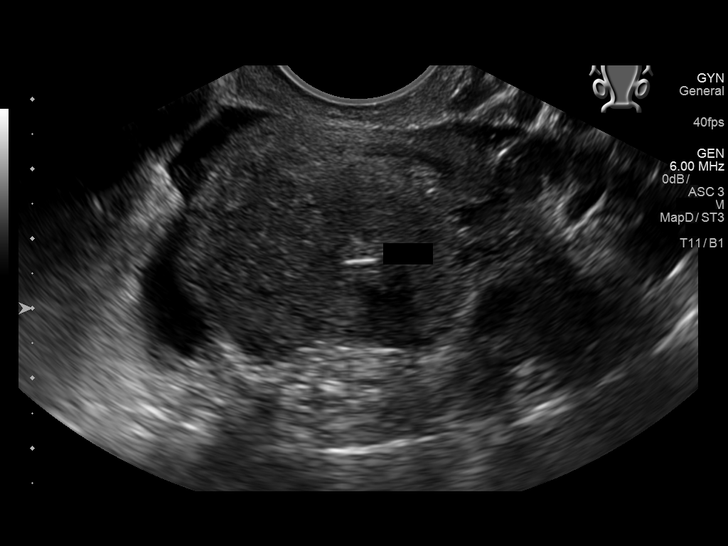
[im 81/108]
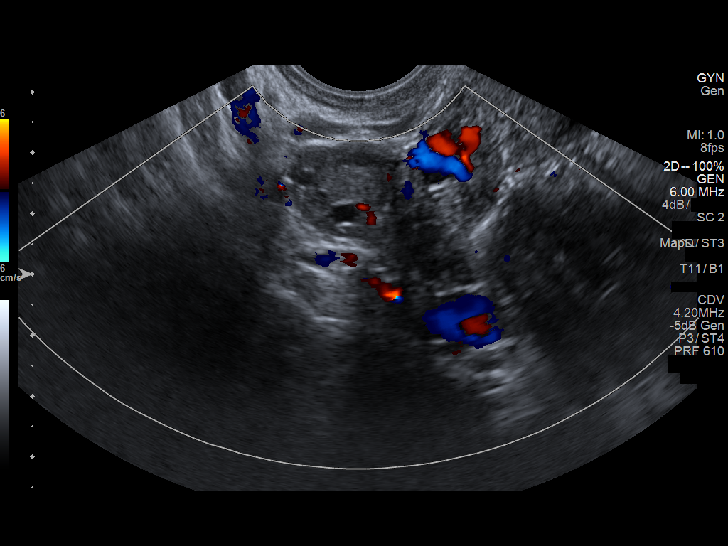
[im 90/108]
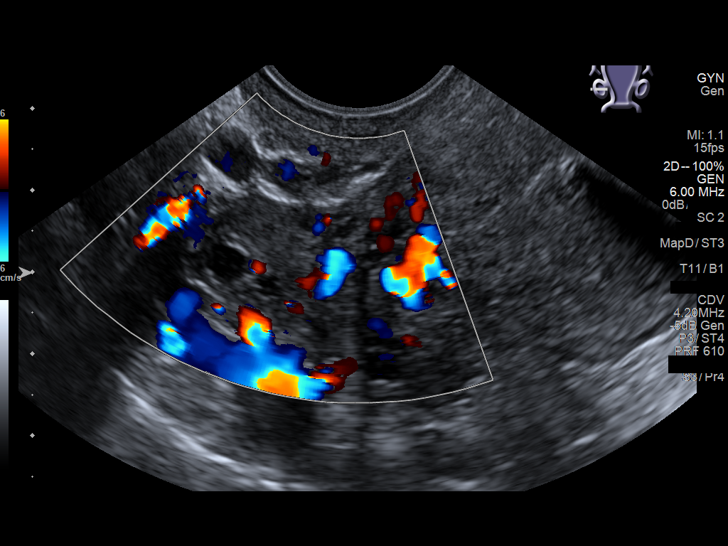
[im 99/108]
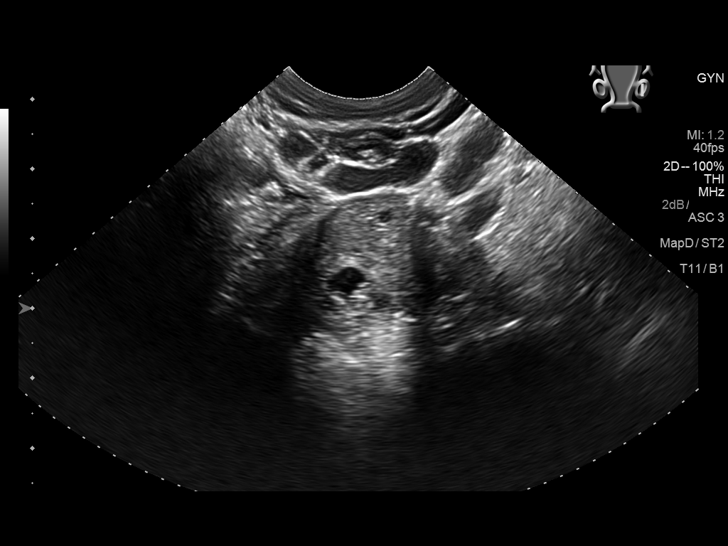
[im 108/108]
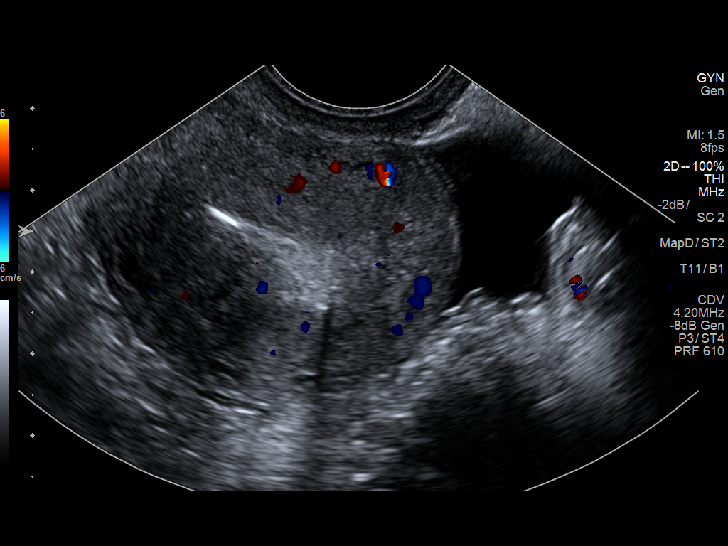

[13 of 25 positions shown; findings below may reference images not displayed]

FINDINGS: Uterus

Measurements: 9.0 x 3.3 x 5.3 cm. Intrauterine device visualized
which appears in satisfactory position within the endometrial
cavity. No fluid is identified in the endometrial cavity. No focal
uterine masses.

Endometrium

Thickness: 10 mm.  No focal abnormality visualized.

Right ovary

Measurements: 3.7 x 1.4 x 1.9 cm. Normal appearance/no adnexal mass.
Normal follicles identified.

Left ovary

Measurements: 2.8 x 1.9 x 2.1 cm. Normal appearance/no adnexal mass.
Normal follicles present.

Other findings

Small to moderate amount of anechoic free fluid is identified in the
cul-de-sac.
IMPRESSION: 1. Satisfactory positioning of the intrauterine device without
evidence of complication.
2. Moderate free fluid in the cul-de-sac. This fluid appears simple
and nonhemorrhagic.

## 2018-07-07 ENCOUNTER — Inpatient Hospital Stay
Admit: 2018-07-07 | Discharge: 2018-07-07 | Disposition: A | Discharge status: Home / Self Care | Payer: PRIVATE HEALTH INSURANCE | Attending: Emergency Medicine

## 2018-07-07 DIAGNOSIS — J029 Acute pharyngitis, unspecified: Secondary | ICD-10-CM

## 2018-07-07 LAB — POC URINE MACROSCOPIC
Bilirubin, Urine: NEGATIVE
Bilirubin: NEGATIVE
Blood, Urine: NEGATIVE
Blood: NEGATIVE
Glucose, Ur: NEGATIVE mg/dl
Glucose: NEGATIVE mg/dl
Ketone: NEGATIVE mg/dl
Ketones, Urine: NEGATIVE mg/dl
Nitrite, Urine: NEGATIVE
Nitrites: NEGATIVE
Specific Gravity, UA: 1.025 (ref 1.005–1.030)
Specific gravity: 1.025 (ref 1.005–1.030)
Urobilinogen, UA, POCT: 0.2 EU/dl (ref 0.0–1.0)
Urobilinogen: 0.2 EU/dl (ref 0.0–1.0)
pH (UA): 7 (ref 5–9)
pH, UA: 7 (ref 5–9)

## 2018-07-07 LAB — WET PREP

## 2018-07-07 LAB — POC HCG,URINE
HCG urine, QL: NEGATIVE
Pregnancy Test(Urn): NEGATIVE

## 2018-07-07 LAB — CT/GC DNA
CHLAMYDIA TRACHOMATIS DNA, CTDNA: NOT DETECTED
CHLAMYDIA TRACHOMATIS DNA: NOT DETECTED
NEISSERIA GONORRHOEAE DNA, GCDNA: NOT DETECTED
NEISSERIA GONORRHOEAE DNA: NOT DETECTED

## 2018-07-07 LAB — POC URINE MICROSCOPIC

## 2018-07-07 LAB — STREP GRP A BY PCR
Group A strep by PCR: NEGATIVE
STREP GROUP A,PCR: NEGATIVE

## 2018-07-07 MED ORDER — NYSTATIN 100,000 UNIT/G OINTMENT
100000 unit/gram | Freq: Two times a day (BID) | CUTANEOUS | 0 refills | Status: AC
Start: 2018-07-07 — End: 2018-07-21

## 2018-07-07 MED ORDER — AZITHROMYCIN 250 MG TAB
250 mg | ORAL | Status: AC
Start: 2018-07-07 — End: 2018-07-07
  Administered 2018-07-07: 07:00:00 via ORAL

## 2018-07-07 MED ORDER — LIDOCAINE (PF) 10 MG/ML (1 %) IJ SOLN
10 mg/mL (1 %) | Freq: Once | INTRAMUSCULAR | Status: AC
Start: 2018-07-07 — End: 2018-07-07
  Administered 2018-07-07: 07:00:00 via INTRAMUSCULAR

## 2018-07-07 MED ORDER — FLUCONAZOLE 150 MG TAB
150 mg | ORAL_TABLET | ORAL | 0 refills | Status: AC
Start: 2018-07-07 — End: 2018-07-07

## 2018-07-07 MED FILL — AZITHROMYCIN 250 MG TAB: 250 mg | ORAL | Qty: 4

## 2018-07-07 MED FILL — CEFTRIAXONE 250 MG SOLUTION FOR INJECTION: 250 mg | INTRAMUSCULAR | Qty: 250

## 2018-07-07 NOTE — ED Notes (Signed)
Pt states sore throat for 3 weeks now pt afibrile at this time

## 2018-07-07 NOTE — ED Triage Notes (Signed)
Complaint of sore throat for last week.

## 2018-07-07 NOTE — ED Notes (Signed)
3:34 AM  07/07/18     Discharge instructions given to [name] Allen (name) with verbalization of understanding. Patient accompanied by self.  Patient discharged with the following prescriptions fluconazole. Patient discharged to home (destination).      [name] Allen

## 2018-07-07 NOTE — ED Provider Notes (Signed)
[facility]  Emergency Department Treatment Report        Patient: [name] Allen Age: 22 y.o. Sex: female    Date of Birth: 1996-06-05 Admit Date: 07/07/2018 PCP: Alfred Levins, MD   MRN: [id number]  CSN: [id number]  Attending: Rolena Infante, MD   Room: ER40/ER40 Time Dictated: 3:10 AM APP: Caren Griffins, PA-C     Chief Complaint   Chief Complaint   Patient presents with   ??? Sore Throat     for 2 weeks per pt             History of Present Illness   22 y.o. female who presents for evaluation of sore throat, rash and vaginal discharge. Patient notes sore throat ongoing for the past 1.5 weeks that she notes really only hurts when swallowing. She reports subjective fevers. She notes slight cough that she relates to allergies. Denies congestion. She notes her daughter is currently being treated for a sinus infection. She also reports slightly pruritic rash with few oval shaped lesions scattered on the torso and extremities that she states she initially thought was ring worm. She notes she has been using hydrocortisone cream on the lesions and it has been improving. She also reports her feet started peeling 1-2 weeks ago. She notes this happened to her once before while she was pregnant and resolved on its own. She states she was googling her symptoms at was concerned about STD causing these symptoms, particularly disseminated gonorrhea or syphilis. She reports she has been having intermittent vaginal discharge. Denies pelvic pain and irritative voiding symptoms. Denies hx of eczema. She has no other complaints at this time.     Review of Systems   Review of Systems   Constitutional: Positive for fever (subjective).   HENT: Positive for sore throat. Negative for congestion and ear pain.    Eyes: Negative for discharge and redness.   Respiratory: Positive for cough. Negative for shortness of breath.    Cardiovascular: Negative for chest pain and leg swelling.    Gastrointestinal: Negative for abdominal pain, nausea and vomiting.   Genitourinary: Negative for dysuria and frequency.   Musculoskeletal: Negative for back pain and neck pain.   Skin: Positive for itching and rash.   Neurological: Negative for dizziness and headaches.       Past Medical/Surgical History     Past Medical History:   Diagnosis Date   ??? Asthma      History reviewed. No pertinent surgical history.    Social History     Social History     Socioeconomic History   ??? Marital status: SINGLE     Spouse name: Not on file   ??? Number of children: Not on file   ??? Years of education: Not on file   ??? Highest education level: Not on file   Occupational History   ??? Not on file   Social Needs   ??? Financial resource strain: Not on file   ??? Food insecurity:     Worry: Not on file     Inability: Not on file   ??? Transportation needs:     Medical: Not on file     Non-medical: Not on file   Tobacco Use   ??? Smoking status: Current Every Day Smoker     Packs/day: 1.00   ??? Smokeless tobacco: Never Used   Substance and Sexual Activity   ??? Alcohol use: Yes     Comment: every  other day   ??? Drug use: No   ??? Sexual activity: Yes     Partners: Male     Birth control/protection: Condom   Lifestyle   ??? Physical activity:     Days per week: Not on file     Minutes per session: Not on file   ??? Stress: Not on file   Relationships   ??? Social connections:     Talks on phone: Not on file     Gets together: Not on file     Attends religious service: Not on file     Active member of club or organization: Not on file     Attends meetings of clubs or organizations: Not on file     Relationship status: Not on file   ??? Intimate partner violence:     Fear of current or ex partner: Not on file     Emotionally abused: Not on file     Physically abused: Not on file     Forced sexual activity: Not on file   Other Topics Concern   ??? Not on file   Social History Narrative   ??? Not on file       Family History     Family History    Problem Relation Age of Onset   ??? Diabetes Maternal Grandmother    ??? Diabetes Paternal Grandfather        Current Medications     (Not in a hospital admission)    Allergies   No Known Allergies    Physical Exam     ED Triage Vitals   ED Encounter Vitals Group      BP 07/07/18 0034 107/75      Pulse (Heart Rate) 07/07/18 0034 69      Resp Rate 07/07/18 0034 18      Temp 07/07/18 0034 98.8 ??F (37.1 ??C)      Temp src --       O2 Sat (%) 07/07/18 0034 100 %      Weight 07/07/18 0024 110 lb      Height 07/07/18 0024 5\' 3"        Constitutional: Patient appears well developed and well nourished. Appears nontoxic and in no distress.   HENT: Normal cephalic and atraumatic.  Mucous membranes moist, non-erythematous and without exudates. No tonsillar swelling. Handling secretions. Uvula is midline. No PTA.   Eyes: Conjunctivae are clear with no discharge.  Neck: Normal ROM and non tender. Supple. No nuchal rigidity.   Respiratory: Breath sounds are equal bilaterally. Lungs clear to auscultation with nonlabored respirations. No tachypnea or accessory muscle use.  Cardiovascular: Normal rate and rhythm.  Distal pulses 2+ and equal bilaterally. Calves soft and non-tender.  Gastrointestinal: Bowel sounds normal. Abdomen soft and without complaint of pain to palpation. No distention. No masses palpable.   Genitourinary: External genitalia without swelling, lesions or discharge.  Vaginal walls have no bulging or lesions.  Cervix pink with no lesions or discharge noted.  No uterine or cervical motion tenderness.  No adnexal mass or tenderness noted.  Musculoskeletal: Moves all extremities well. No lower extremity edema.   Lymphatic: No cervical adenopathy.  Integumentary: Warm and dry. Very dry appearing areas of dry peeling skin scattered on plantar surfaces bilaterally. No erythema or tenderness. No drainage.   Few oval shaped, dry appearing ring-like lesions of the abdomen and extremities.    Neurologic: Alert and oriented. No facial asymmetry or dysarthria.  Psychological: Behavior is age  and situation appropriate. No difficulty with memory.      Impression and Management Plan   This is a 22 y.o. female with sore throat, skin peeling of feet, rash, and vaginal discharge. Skin of feet appears like dry peeling skin. No erythema or drainage. No tenderness. Question dyshidrotic eczema. Rash on skin consistent with fungal infection. Oropharynx is clear and moist without lesions. Will obtain appropriate studies to evaluate the patient's complaints    Diagnostic Studies   Lab:   Recent Results (from the past 24 hour(s))   STREP GRP A BY PCR    Collection Time: 07/07/18  1:30 AM   Result Value Ref Range    Group A strep by PCR  Negative - No Streptococcus Group A DNA was Detected.       Negative - No Streptococcus Group A DNA was Detected.   POC URINE MACROSCOPIC    Collection Time: 07/07/18  1:40 AM   Result Value Ref Range    Glucose Negative NEGATIVE,Negative mg/dl    Bilirubin Negative NEGATIVE,Negative      Ketone Negative NEGATIVE,Negative mg/dl    Specific gravity 8.469 1.005 - 1.030      Blood Negative NEGATIVE,Negative      pH (UA) 7.0 5 - 9      Protein Trace (A) NEGATIVE,Negative mg/dl    Urobilinogen 0.2 0.0 - 1.0 EU/dl    Nitrites Negative NEGATIVE,Negative      Leukocyte Esterase Small (A) NEGATIVE,Negative      Color Yellow      Appearance Clear     POC URINE MICROSCOPIC    Collection Time: 07/07/18  1:40 AM   Result Value Ref Range    Epithelial cells, squamous 15-29 /LPF    WBC 5-9 /HPF    RBC OCCASIONAL /HPF    Mucus PRESENT      Bacteria OCCASIONAL /HPF    Yeast, urine OCCASIONAL /HPF   WET PREP    Collection Time: 07/07/18  1:42 AM   Result Value Ref Range    Wet prep        Fungal elements seen  No Motile Trichomonas Seen  No Clue Cells Seen     CT/GC DNA    Collection Time: 07/07/18  1:42 AM   Result Value Ref Range    CHLAMYDIA TRACHOMATIS DNA NOT DETECTED NOT DETECTED       NEISSERIA GONORRHOEAE DNA NOT DETECTED NOT DETECTED     POC HCG,URINE    Collection Time: 07/07/18  1:43 AM   Result Value Ref Range    HCG urine, QL negative NEGATIVE,Negative,negative       Labs Reviewed   POC URINE MACROSCOPIC - Abnormal; Notable for the following components:       Result Value    Protein Trace (*)     Leukocyte Esterase Small (*)     All other components within normal limits   STREP GRP A BY PCR   WET PREP   CT/GC DNA   POC URINE MICROSCOPIC   POC HCG,URINE       Imaging:    No results found.    ED Course/Medical Decision Making   Strep is negative. Suspect allergies vs viral illness as daughter is also sick. Wet prep shows yeast. Patient desires empiric treatment for gonorrhea and chlamydia. She was instructed to follow up with the health department or her gynecologist for further STD testing. She was given prescription for diflucan and nystatin cream.    I feel the  patient is stable for discharge at this time to continue outpatient management and follow up. Patient was instructed to return to the ER if condition worsens or new symptoms develop. The patient indicates understanding of these issues and agrees with the plan.       Medications   azithromycin (ZITHROMAX) tablet 1,000 mg (1,000 mg Oral Given 07/07/18 0317)   cefTRIAXone (ROCEPHIN) 250 mg in lidocaine (PF) (XYLOCAINE) 10 mg/mL (1 %) IM injection (250 mg IntraMUSCular Given 07/07/18 0318)       Final Diagnosis       ICD-10-CM ICD-9-CM   1. Sore throat J02.9 462   2. Rash R21 782.1   3. Vaginal discharge N89.8 623.5   4. Vaginal candidiasis B37.3 112.1       Disposition   Discharged in stable condition.    Discharge Medication List as of 07/07/2018  3:22 AM      START taking these medications    Details   fluconazole (DIFLUCAN) 150 mg tablet Take 1 Tab by mouth now for 1 dose. FDA advises cautious prescribing of oral fluconazole in pregnancy., Normal, Disp-1 Tab, R-0       nystatin (MYCOSTATIN) 100,000 unit/gram ointment Apply  to affected area two (2) times a day for 14 days., Normal, Disp-45 g, R-0             Patient has been evaluated by myself and MANOLIO, Ferdinand Lango, MD who agrees with the above assessment and plan.    Caren Griffins, PA-C  July 07, 2018  3:10 AM      My signature above authenticates this document and my orders, the final diagnosis (es), discharge prescription (s), and instructions in the Epic record.  If you have any questions please contact [phone].     Nursing notes have been reviewed by the physician/ advanced practice    Clinician.

## 2018-07-07 NOTE — ED Notes (Signed)
Complaint of sore throat for last week.

## 2018-07-07 NOTE — ED Notes (Signed)
Pt states sore throat for 3 weeks now pt afibrile at this time

## 2018-07-07 NOTE — ED Notes (Signed)
3:34 AM  07/07/18     Discharge instructions given to [facility] (name) with verbalization of understanding. Patient accompanied by self.  Patient discharged with the following prescriptions fluconazole. Patient discharged to home (destination).      Greig Castilla

## 2018-07-07 NOTE — ED Provider Notes (Signed)
ED Provider Notes by Caren Griffins, PA-C at 07/07/18 0310                Author: Caren Griffins, PA-C  Service: Emergency Medicine  Author Type: Physician Assistant       Filed: 07/08/18 0819  Date of Service: 07/07/18 0310  Status: Attested           Editor: Bitzer, Philip Aspen (Physician Assistant)  Cosigner: Rolena Infante, MD at 07/10/18 [phone]          Attestation signed by Rolena Infante, MD at 07/10/18 1917          I, Rolena Infante, MD , have personally seen and examined this patient; I have fully participated in the care of this patient with the advanced practice  provider.  I have reviewed and agree with all pertinent clinical information including history, physical exam, labs, radiographic studies and the plan.  I have also reviewed and agree with the medications, allergies and past medical history sections for  this patient.  Agree with empiric treatment of GC and Chlamydia, will follow up with the health department for further testing as needed.  Is also given Diflucan and nystatin cream.      Rolena Infante, MD   July 10, 2018                                    [facility]   Emergency Department Treatment Report                Patient: [name] Allen  Age: 22 y.o.  Sex: female          Date of Birth: 09/26/1996  Admit Date: 07/07/2018  PCP: Alfred Levins, MD     MRN: [id number]   CSN: [id number]   Attending: Rolena Infante, MD         Room: ER40/ER40  Time Dictated: 3:10 AM  APP: Caren Griffins, PA-C        Chief Complaint      Chief Complaint       Patient presents with        ?  Sore Throat             for 2 weeks per pt                   History of Present Illness     22 y.o. female  who presents for evaluation of sore throat, rash and vaginal discharge. Patient notes sore throat ongoing for the past 1.5 weeks that she notes really only hurts when swallowing. She reports subjective fevers. She notes slight cough that she relates  to  allergies. Denies congestion. She notes her daughter is currently being treated for a sinus infection. She also reports slightly pruritic rash with few oval shaped lesions scattered on the torso and extremities that she states she initially thought  was ring worm. She notes she has been using hydrocortisone cream on the lesions and it has been improving. She also reports her feet started peeling 1-2 weeks ago. She notes this happened to her once before while she was pregnant and resolved on its own.  She states she was googling her symptoms at was concerned about STD causing these symptoms, particularly disseminated gonorrhea or syphilis. She reports she has been having intermittent vaginal discharge. Denies pelvic  pain and irritative voiding symptoms.  Denies hx of eczema. She has no other complaints at this time.         Review of Systems     Review of Systems    Constitutional: Positive for fever (subjective) .    HENT: Positive for sore throat. Negative for congestion and ear pain.     Eyes: Negative for discharge and redness.    Respiratory: Positive for cough. Negative for shortness of breath.     Cardiovascular: Negative for chest pain and leg swelling.    Gastrointestinal: Negative for abdominal pain, nausea and vomiting.    Genitourinary: Negative for dysuria and frequency.    Musculoskeletal: Negative for back pain and neck pain.    Skin: Positive for itching and rash .    Neurological: Negative for dizziness and headaches.            Past Medical/Surgical History          Past Medical History:        Diagnosis  Date         ?  Asthma          History reviewed. No pertinent surgical history.        Social History          Social History          Socioeconomic History         ?  Marital status:  SINGLE              Spouse name:  Not on file         ?  Number of children:  Not on file     ?  Years of education:  Not on file     ?  Highest education level:  Not on file       Occupational History        ?  Not  on file       Social Needs         ?  Financial resource strain:  Not on file        ?  Food insecurity:              Worry:  Not on file         Inability:  Not on file        ?  Transportation needs:              Medical:  Not on file         Non-medical:  Not on file       Tobacco Use         ?  Smoking status:  Current Every Day Smoker              Packs/day:  1.00         ?  Smokeless tobacco:  Never Used       Substance and Sexual Activity         ?  Alcohol use:  Yes             Comment: every other day         ?  Drug use:  No     ?  Sexual activity:  Yes              Partners:  Male         Birth control/protection:  Condom       Lifestyle        ?  Physical activity:              Days per week:  Not on file         Minutes per session:  Not on file         ?  Stress:  Not on file       Relationships        ?  Social connections:              Talks on phone:  Not on file         Gets together:  Not on file         Attends religious service:  Not on file         Active member of club or organization:  Not on file         Attends meetings of clubs or organizations:  Not on file         Relationship status:  Not on file        ?  Intimate partner violence:              Fear of current or ex partner:  Not on file         Emotionally abused:  Not on file         Physically abused:  Not on file         Forced sexual activity:  Not on file        Other Topics  Concern        ?  Not on file       Social History Narrative        ?  Not on file             Family History          Family History         Problem  Relation  Age of Onset          ?  Diabetes  Maternal Grandmother            ?  Diabetes  Paternal Grandfather               Current Medications        (Not in a hospital admission)        Allergies     No Known Allergies        Physical Exam          ED Triage Vitals     ED Encounter Vitals Group            BP  07/07/18 0034  107/75        Pulse (Heart Rate)  07/07/18 0034  69        Resp Rate  07/07/18 0034   18        Temp  07/07/18 0034  98.8 ??F (37.1 ??C)        Temp src  --          O2 Sat (%)  07/07/18 0034  100 %        Weight  07/07/18 0024  110 lb            Height  07/07/18 0024  5\' 3"            Constitutional: Patient appears well developed and well nourished. Appears nontoxic and in no distress.    HENT: Normal cephalic and atraumatic.   Mucous membranes moist, non-erythematous and without exudates.  No tonsillar swelling. Handling secretions. Uvula is midline. No PTA.    Eyes: Conjunctivae are clear with no discharge.   Neck: Normal ROM and non tender. Supple. No nuchal rigidity.    Respiratory: Breath sounds are equal bilaterally. Lungs clear to auscultation with nonlabored respirations. No tachypnea or accessory muscle  use.   Cardiovascular: Normal rate and rhythm.  Distal pulses 2+ and equal bilaterally. Calves soft and non-tender.   Gastrointestinal: Bowel sounds normal. Abdomen soft and without complaint of pain to palpation. No distention. No masses palpable.    Genitourinary: External genitalia without swelling, lesions or discharge.  Vaginal walls have no bulging or lesions.  Cervix pink with no lesions  or discharge noted.  No uterine or cervical motion tenderness.  No adnexal mass or tenderness noted.   Musculoskeletal: Moves all extremities well. No lower extremity edema.    Lymphatic: No cervical adenopathy.   Integumentary: Warm and dry. Very dry appearing areas of dry peeling skin scattered on plantar surfaces bilaterally. No erythema or tenderness.  No drainage.    Few oval shaped, dry appearing ring-like lesions of the abdomen and extremities.    Neurologic: Alert and oriented. No facial asymmetry or dysarthria.   Psychological: Behavior is age and situation appropriate. No difficulty with memory.          Impression and Management Plan     This is a 22 y.o.  female with sore throat, skin peeling of feet, rash, and vaginal discharge. Skin of feet appears like dry peeling skin. No erythema or  drainage. No tenderness. Question dyshidrotic eczema. Rash on skin  consistent with fungal infection. Oropharynx is clear and moist without lesions. Will obtain appropriate studies to evaluate the patient's complaints        Diagnostic Studies     Lab:      Recent Results (from the past 24 hour(s))     STREP GRP A BY PCR          Collection Time: 07/07/18  1:30 AM         Result  Value  Ref Range            Group A strep by PCR    Negative - No Streptococcus Group A DNA was Detected.               Negative - No Streptococcus Group A DNA was Detected.       POC URINE MACROSCOPIC          Collection Time: 07/07/18  1:40 AM         Result  Value  Ref Range            Glucose  Negative  NEGATIVE,Negative mg/dl       Bilirubin  Negative  NEGATIVE,Negative         Ketone  Negative  NEGATIVE,Negative mg/dl       Specific gravity  1.025  1.005 - 1.030         Blood  Negative  NEGATIVE,Negative         pH (UA)  7.0  5 - 9         Protein  Trace (A)  NEGATIVE,Negative mg/dl       Urobilinogen  0.2  0.0 - 1.0 EU/dl       Nitrites  Negative  NEGATIVE,Negative         Leukocyte Esterase  Small (A)  NEGATIVE,Negative         Color  Yellow          Appearance  Clear          POC URINE MICROSCOPIC          Collection Time: 07/07/18  1:40 AM         Result  Value  Ref Range            Epithelial cells, squamous  15-29  /LPF       WBC  5-9  /HPF       RBC  OCCASIONAL  /HPF       Mucus  PRESENT          Bacteria  OCCASIONAL  /HPF       Yeast, urine  OCCASIONAL  /HPF       WET PREP          Collection Time: 07/07/18  1:42 AM         Result  Value  Ref Range            Wet prep                  Fungal elements seen   No Motile Trichomonas Seen   No Clue Cells Seen          CT/GC DNA          Collection Time: 07/07/18  1:42 AM         Result  Value  Ref Range            CHLAMYDIA TRACHOMATIS DNA  NOT DETECTED  NOT DETECTED         NEISSERIA GONORRHOEAE DNA  NOT DETECTED  NOT DETECTED         POC HCG,URINE          Collection Time:  07/07/18  1:43 AM         Result  Value  Ref Range            HCG urine, QL  negative  NEGATIVE,Negative,negative            Labs Reviewed       POC URINE MACROSCOPIC - Abnormal; Notable for the following components:            Result  Value            Protein  Trace (*)         Leukocyte Esterase  Small (*)            All other components within normal limits       STREP GRP A BY PCR     WET PREP     CT/GC DNA     POC URINE MICROSCOPIC       POC HCG,URINE           Imaging:     No results found.        ED Course/Medical Decision Making     Strep is negative. Suspect allergies vs viral illness as daughter is also sick. Wet prep shows yeast. Patient desires empiric treatment for gonorrhea and chlamydia. She was instructed  to follow up with the health department or her gynecologist for further STD testing. She was given prescription for diflucan and nystatin cream.     I feel the patient is stable for discharge at this time to continue outpatient management and follow up. Patient was instructed to return to the ER if condition worsens or new symptoms develop. The patient indicates understanding of these  issues and  agrees with the plan.            Medications       azithromycin (ZITHROMAX) tablet 1,000 mg (1,000 mg Oral Given 07/07/18 0317)       cefTRIAXone (ROCEPHIN) 250 mg in lidocaine (PF) (XYLOCAINE) 10 mg/mL (1 %) IM injection (250 mg IntraMUSCular Given 07/07/18 0318)             Final Diagnosis                 ICD-10-CM  ICD-9-CM          1.  Sore throat  J02.9  462     2.  Rash  R21  782.1     3.  Vaginal discharge  N89.8  623.5          4.  Vaginal candidiasis  B37.3  112.1             Disposition     Discharged in stable condition.        Discharge Medication List as of 07/07/2018  3:22 AM              START taking these medications          Details        fluconazole (DIFLUCAN) 150 mg tablet  Take 1 Tab by mouth now for 1 dose. FDA advises cautious prescribing of oral fluconazole in pregnancy., Normal,  Disp-1 Tab, R-0               nystatin (MYCOSTATIN) 100,000 unit/gram ointment  Apply  to affected area two (2) times a day for 14 days., Normal, Disp-45 g, R-0                         Patient has been evaluated by myself and MANOLIO, Ferdinand Lango, MD who agrees with the above assessment and plan.      Caren Griffins, PA-C   July 07, 2018   3:10 AM         My signature above authenticates this document and my orders, the final diagnosis (es), discharge prescription (s), and instructions in the Epic record.   If you have any questions please contact [phone].       Nursing notes have been reviewed by the physician/ advanced practice     Clinician.

## 2019-06-03 ENCOUNTER — Inpatient Hospital Stay
Admit: 2019-06-03 | Discharge: 2019-06-04 | Disposition: A | Discharge status: Home / Self Care | Payer: PRIVATE HEALTH INSURANCE | Attending: Emergency Medicine

## 2019-06-03 ENCOUNTER — Emergency Department: Admit: 2019-06-03 | Payer: PRIVATE HEALTH INSURANCE | Primary: Pediatrics

## 2019-06-03 DIAGNOSIS — N760 Acute vaginitis: Secondary | ICD-10-CM

## 2019-06-03 LAB — COMPREHENSIVE METABOLIC PANEL
ALT: 33 U/L (ref 12–78)
AST: 26 U/L (ref 15–37)
Albumin: 3.8 gm/dl (ref 3.4–5.0)
Alkaline Phosphatase: 71 U/L (ref 45–117)
Anion Gap: 5 mmol/L (ref 5–15)
BUN: 10 mg/dl (ref 7–25)
CO2: 26 mEq/L (ref 21–32)
Calcium: 9.5 mg/dl (ref 8.5–10.1)
Chloride: 106 mEq/L (ref 98–107)
Creatinine: 0.9 mg/dl (ref 0.6–1.3)
EGFR IF NonAfrican American: >60
GFR African American: >60
Glucose: 73 mg/dl — ABNORMAL LOW (ref 74–106)
Potassium: 3.7 mEq/L (ref 3.5–5.1)
Sodium: 137 mEq/L (ref 136–145)
Total Bilirubin: 0.3 mg/dl (ref 0.2–1.0)
Total Protein: 8.4 gm/dl — ABNORMAL HIGH (ref 6.4–8.2)

## 2019-06-03 LAB — POC URINE MACROSCOPIC
Bilirubin, Urine: NEGATIVE
Bilirubin: NEGATIVE
Blood, Urine: NEGATIVE
Blood: NEGATIVE
Glucose, Ur: NEGATIVE mg/dl
Glucose: NEGATIVE mg/dl
Ketone: NEGATIVE mg/dl
Ketones, Urine: NEGATIVE mg/dl
Leukocyte Esterase, Urine: NEGATIVE
Leukocyte Esterase: NEGATIVE
Nitrite, Urine: NEGATIVE
Nitrites: NEGATIVE
Protein, UA: NEGATIVE mg/dl
Protein: NEGATIVE mg/dl
Specific Gravity, UA: 1.02 (ref 1.005–1.030)
Specific gravity: 1.02 (ref 1.005–1.030)
Urobilinogen, UA, POCT: 1 EU/dl (ref 0.0–1.0)
Urobilinogen: 1 EU/dl (ref 0.0–1.0)
pH (UA): 7 (ref 5–9)
pH, UA: 7 (ref 5–9)

## 2019-06-03 LAB — CBC WITH AUTO DIFFERENTIAL
Basophils %: 0.5 % (ref 0–3)
Eosinophils %: 3.6 % (ref 0–5)
Hematocrit: 40.6 % (ref 37.0–50.0)
Hemoglobin: 12.8 gm/dl — ABNORMAL LOW (ref 13.0–17.2)
Immature Granulocytes: 0.2 % (ref 0.0–3.0)
Lymphocytes %: 40 % (ref 28–48)
MCH: 26.7 pg (ref 25.4–34.6)
MCHC: 31.5 gm/dl (ref 30.0–36.0)
MCV: 84.8 fL (ref 80.0–98.0)
MPV: 10.9 fL — ABNORMAL HIGH (ref 6.0–10.0)
Monocytes %: 10.4 % (ref 1–13)
Neutrophils %: 45.3 % (ref 34–64)
Nucleated RBCs: 0 (ref 0–0)
Platelets: 244 10*3/uL (ref 140–450)
RBC: 4.79 M/uL (ref 3.60–5.20)
RDW-SD: 48.4 — ABNORMAL HIGH (ref 36.4–46.3)
WBC: 6.3 10*3/uL (ref 4.0–11.0)

## 2019-06-03 LAB — WET PREP
Wet Prep: NONE SEEN
Wet prep: NONE SEEN

## 2019-06-03 LAB — ANTIBODY SCREEN
Antibody Screen: NEGATIVE
Antibody screen: NEGATIVE

## 2019-06-03 LAB — BLOOD TYPE, (ABO+RH)
ABO/Rh(D): O POS
ABO/Rh: O POS

## 2019-06-03 LAB — BETA HCG, QT
HCG, BETA, HCGTLT: 18365 m[IU]/mL
HCG, beta, QT: 18365 m[IU]/mL

## 2019-06-03 LAB — LIPASE
Lipase: 164 U/L (ref 73–393)
Lipase: 164 U/L (ref 73–393)

## 2019-06-03 LAB — POC HCG,URINE
HCG urine, QL: POSITIVE — AB
Pregnancy Test(Urn): POSITIVE — AB

## 2019-06-03 LAB — METABOLIC PANEL, COMPREHENSIVE
ALT (SGPT): 33 U/L (ref 12–78)
AST (SGOT): 26 U/L (ref 15–37)
Albumin: 3.8 gm/dl (ref 3.4–5.0)
Alk. phosphatase: 71 U/L (ref 45–117)
Anion gap: 5 mmol/L (ref 5–15)
BUN: 10 mg/dl (ref 7–25)
Bilirubin, total: 0.3 mg/dl (ref 0.2–1.0)
CO2: 26 mEq/L (ref 21–32)
Calcium: 9.5 mg/dl (ref 8.5–10.1)
Chloride: 106 mEq/L (ref 98–107)
Creatinine: 0.9 mg/dl (ref 0.6–1.3)
GFR est AA: >60
GFR est non-AA: >60
Glucose: 73 mg/dl — ABNORMAL LOW (ref 74–106)
Potassium: 3.7 mEq/L (ref 3.5–5.1)
Protein, total: 8.4 gm/dl — ABNORMAL HIGH (ref 6.4–8.2)
Sodium: 137 mEq/L (ref 136–145)

## 2019-06-03 LAB — CBC WITH AUTOMATED DIFF
BASOPHILS: 0.5 % (ref 0–3)
EOSINOPHILS: 3.6 % (ref 0–5)
HCT: 40.6 % (ref 37.0–50.0)
HGB: 12.8 gm/dl — ABNORMAL LOW (ref 13.0–17.2)
IMMATURE GRANULOCYTES: 0.2 % (ref 0.0–3.0)
LYMPHOCYTES: 40 % (ref 28–48)
MCH: 26.7 pg (ref 25.4–34.6)
MCHC: 31.5 gm/dl (ref 30.0–36.0)
MCV: 84.8 fL (ref 80.0–98.0)
MONOCYTES: 10.4 % (ref 1–13)
MPV: 10.9 fL — ABNORMAL HIGH (ref 6.0–10.0)
NEUTROPHILS: 45.3 % (ref 34–64)
NRBC: 0 (ref 0–0)
PLATELET: 244 10*3/uL (ref 140–450)
RBC: 4.79 M/uL (ref 3.60–5.20)
RDW-SD: 48.4 — ABNORMAL HIGH (ref 36.4–46.3)
WBC: 6.3 10*3/uL (ref 4.0–11.0)

## 2019-06-03 MED ORDER — SODIUM CHLORIDE 0.9 % IJ SYRG
Freq: Once | INTRAMUSCULAR | Status: AC
Start: 2019-06-03 — End: 2019-06-03
  Administered 2019-06-03: 20:00:00 via INTRAVENOUS

## 2019-06-03 MED ORDER — SODIUM CHLORIDE 0.9% BOLUS IV
0.9 % | INTRAVENOUS | Status: AC
Start: 2019-06-03 — End: 2019-06-03
  Administered 2019-06-03: 21:00:00 via INTRAVENOUS

## 2019-06-03 MED ORDER — ONDANSETRON (PF) 4 MG/2 ML INJECTION
4 mg/2 mL | Freq: Once | INTRAMUSCULAR | Status: AC
Start: 2019-06-03 — End: 2019-06-03
  Administered 2019-06-03: 21:00:00 via INTRAVENOUS

## 2019-06-03 MED ORDER — FAMOTIDINE (PF) 20 MG/2 ML IV
20 mg/2 mL | INTRAVENOUS | Status: AC
Start: 2019-06-03 — End: 2019-06-03
  Administered 2019-06-03: 21:00:00 via INTRAVENOUS

## 2019-06-03 MED FILL — ONDANSETRON (PF) 4 MG/2 ML INJECTION: 4 mg/2 mL | INTRAMUSCULAR | Qty: 2

## 2019-06-03 MED FILL — FAMOTIDINE (PF) 20 MG/2 ML IV: 20 mg/2 mL | INTRAVENOUS | Qty: 2

## 2019-06-03 NOTE — ED Notes (Signed)
Blood work sent to lab

## 2019-06-03 NOTE — ED Notes (Signed)
I called lab twice about the patient's urinalysis and hCG that was collected lab said they did not have it twice so we will have patient give a urine and dip it in the ER

## 2019-06-03 NOTE — ED Notes (Signed)
PT returned from ultrasound via transport tech  Denies any further needs at this time

## 2019-06-03 NOTE — ED Notes (Signed)
Pt ambulated to restroom.

## 2019-06-03 NOTE — ED Triage Notes (Signed)
+  home preg test 2 days ago c/o abdominal pain and vaginal discharge

## 2019-06-03 NOTE — ED Notes (Signed)
Pt transported to US via transport tech

## 2019-06-03 NOTE — ED Notes (Signed)
POC urine ran, blanket given and lights turned down for pt comfort, no other needs at present time.

## 2019-06-03 NOTE — ED Provider Notes (Signed)
[place]  Emergency Department Treatment Report    Patient: [name] Allen Age: 23 y.o. Sex: female    Date of Birth: [birth year] Admit Date: 06/03/2019 PCP: Eliott Nine, MD   MRN: [id number]  CSN: [id number]     Room: ER50/ER50 Time Dictated: 7:29 PM         Chief Complaint   *Pregnancy problem  History of Present Illness   23 y.o. female who states she is pregnant and her last menstrual period was June 27 she typically sees Dr. sample for GYN   she is a G2, P1 with twins, she said she took a pregnancy test 2 days ago and it was positive.  She said 2 days ago she started getting cramping in her pelvis but also all over her abdomen kind of in her left upper quadrant as well they come and go nothing makes them better or worse she is not having vaginal bleeding she is nauseous but has not vomited or had diarrhea or taken anything for her symptoms.  She was concerned and wanted to come to the ER.  No travel or contact with anyone with coronavirus.    Review of Systems   Review of Systems   Constitutional: Negative for fever.   HENT: Negative for congestion and sore throat.    Eyes: Negative for redness.   Respiratory: Negative for cough and shortness of breath.    Cardiovascular: Negative for chest pain.   Gastrointestinal: Positive for nausea. Negative for diarrhea and vomiting.        Cramping   Genitourinary: Negative for dysuria.   Musculoskeletal: Negative for falls.   Skin: Negative for rash.   Neurological: Negative for loss of consciousness and headaches.        Past Medical/Surgical History     Past Medical History:   Diagnosis Date   ??? Asthma      History reviewed. No pertinent surgical history.  *Patient denies any medical or surgical history  Social History     Social History     Socioeconomic History   ??? Marital status: SINGLE     Spouse name: Not on file   ??? Number of children: Not on file   ??? Years of education: Not on file   ??? Highest education level: Not on file    Tobacco Use   ??? Smoking status: Current Every Day Smoker     Packs/day: 1.00   ??? Smokeless tobacco: Never Used   Substance and Sexual Activity   ??? Alcohol use: Yes     Comment: every other day   ??? Drug use: Yes     Frequency: 7.0 times per week     Types: Marijuana   ??? Sexual activity: Yes     Partners: Male     Birth control/protection: Condom        Family History     Family History   Problem Relation Age of Onset   ??? Diabetes Maternal Grandmother    ??? Diabetes Paternal Grandfather         Current Medications   She is not taking medicines  None     Allergies   No Known Allergies  Physical Exam     ED Triage Vitals   Enc Vitals Group      BP 06/03/19 1253 (!) 135/103      Pulse (Heart Rate) 06/03/19 1253 92      Resp Rate 06/03/19 1253 (!) 1  Temp 06/03/19 1253 98.4 ??F (36.9 ??C)      Temp src --       O2 Sat (%) 06/03/19 1253 97 %      Weight 06/03/19 1545 102 lb      Height 06/03/19 1545 5' 4"       Head Circumference --       Peak Flow --       Pain Score --       Pain Loc --       Pain Edu? --       Excl. in [place]? --        Physical Exam  HENT:      Head: Normocephalic.   Eyes:      General: No scleral icterus.  Cardiovascular:      Rate and Rhythm: Normal rate and regular rhythm.   Pulmonary:      Effort: Pulmonary effort is normal.      Breath sounds: Normal breath sounds.   Abdominal:      Comments: Soft and the abdomen is completely nontender   Genitourinary:     Comments: There is normal external female genitalia there is scant white discharge in the vault no bleeding, there is no cervical motion tenderness or reproducible pelvic or adnexal tenderness  Skin:     General: Skin is warm and dry.   Neurological:      General: No focal deficit present.      Mental Status: She is alert and oriented to person, place, and time.   Psychiatric:         Mood and Affect: Mood normal.          Impression and Management Plan   *Patient is presenting with cramping her abdomen is completely benign no  bleeding noted on exam I have ordered labs hCG and urine.  She will have an ultrasound.    Initial blood pressure 135/103 recheck 110/79 pulse of 68 normal sats  Diagnostic   Lab:   Recent Results (from the past 12 hour(s))   WET PREP    Collection Time: 06/03/19  4:01 PM    Specimen: Vagina; Genital   Result Value Ref Range    Wet prep        No Yeast or motile Trichomonas seen  Clue Cells Seen     CBC WITH AUTOMATED DIFF    Collection Time: 06/03/19  4:33 PM   Result Value Ref Range    WBC 6.3 4.0 - 11.0 1000/mm3    RBC 4.79 3.60 - 5.20 M/uL    HGB 12.8 (L) 13.0 - 17.2 gm/dl    HCT 40.6 37.0 - 50.0 %    MCV 84.8 80.0 - 98.0 fL    MCH 26.7 25.4 - 34.6 pg    MCHC 31.5 30.0 - 36.0 gm/dl    PLATELET 244 140 - 450 1000/mm3    MPV 10.9 (H) 6.0 - 10.0 fL    RDW-SD 48.4 (H) 36.4 - 46.3      NRBC 0 0 - 0      IMMATURE GRANULOCYTES 0.2 0.0 - 3.0 %    NEUTROPHILS 45.3 34 - 64 %    LYMPHOCYTES 40.0 28 - 48 %    MONOCYTES 10.4 1 - 13 %    EOSINOPHILS 3.6 0 - 5 %    BASOPHILS 0.5 0 - 3 %   METABOLIC PANEL, COMPREHENSIVE    Collection Time: 06/03/19  4:33 PM   Result Value Ref  Range    Sodium 137 136 - 145 mEq/L    Potassium 3.7 3.5 - 5.1 mEq/L    Chloride 106 98 - 107 mEq/L    CO2 26 21 - 32 mEq/L    Glucose 73 (L) 74 - 106 mg/dl    BUN 10 7 - 25 mg/dl    Creatinine 0.9 0.6 - 1.3 mg/dl    GFR est AA >60.0      GFR est non-AA >60      Calcium 9.5 8.5 - 10.1 mg/dl    AST (SGOT) 26 15 - 37 U/L    ALT (SGPT) 33 12 - 78 U/L    Alk. phosphatase 71 45 - 117 U/L    Bilirubin, total 0.3 0.2 - 1.0 mg/dl    Protein, total 8.4 (H) 6.4 - 8.2 gm/dl    Albumin 3.8 3.4 - 5.0 gm/dl    Anion gap 5 5 - 15 mmol/L   LIPASE    Collection Time: 06/03/19  4:33 PM   Result Value Ref Range    Lipase 164 73 - 393 U/L   BLOOD TYPE, (ABO+RH)    Collection Time: 06/03/19  4:33 PM   Result Value Ref Range    ABO/Rh(D) O Rh Positive     BETA HCG, QT    Collection Time: 06/03/19  4:33 PM   Result Value Ref Range    HCG, beta, QT 18,365 mIU/ml   POC HCG,URINE     Collection Time: 06/03/19  4:52 PM   Result Value Ref Range    HCG urine, QL positive (A) NEGATIVE,Negative,negative     POC URINE MACROSCOPIC    Collection Time: 06/03/19  5:05 PM   Result Value Ref Range    Glucose Negative NEGATIVE,Negative mg/dl    Bilirubin Negative NEGATIVE,Negative      Ketone Negative NEGATIVE,Negative mg/dl    Specific gravity 1.020 1.005 - 1.030      Blood Negative NEGATIVE,Negative      pH (UA) 7.0 5 - 9      Protein Negative NEGATIVE,Negative mg/dl    Urobilinogen 1.0 0.0 - 1.0 EU/dl    Nitrites Negative NEGATIVE,Negative      Leukocyte Esterase Negative NEGATIVE,Negative      Color Yellow      Appearance Clear     ANTIBODY SCREEN    Collection Time: 06/03/19  5:26 PM   Result Value Ref Range    Antibody screen NEG         Imaging:    [place] Uts Transvaginal Ob    Result Date: 06/03/2019  EARLY OBSTETRIC ULTRASOUND: HISTORY: pelvic pain with doppler; Size, dates and viability. TECHNIQUE: Transvaginal imaging was performed with grayscale and color Doppler. Spectral Doppler evaluation of the ovaries was also performed. FINDINGS: The uterus measures 9.9 x 5.2 x 6.2 cm. The right ovary measures 3.5 x 2.7 x 1.9 cm. The left ovary measures 2.8 x 2.8 x 2.2 cm. A corpus luteum on the right ovary measures 1.8 x 1.3 x 1.9 cm. Normal arterial and venous spectral Doppler waveforms are identified in both ovaries. There is a single gestational sac within the uterus with mean sac diameter of 1.71 cm. This corresponds to a 6 weeks 4 day gestation. There is a normal appearing yolk sac. No embryo is identified. There is no evidence of subchorionic bleed.     IMPRESSION: Single intrauterine gestation of uncertain viability with no embryo identified. Follow-up with hCG levels and ultrasound in 7-10 days is recommended.  ED Course/Medical Decision Making        Medications   sodium chloride (NS) flush 5-10 mL (10 mL IntraVENous Given 06/03/19 1559)    ondansetron (ZOFRAN) injection 4 mg (4 mg IntraVENous Given 06/03/19 1636)   famotidine (PF) (PEPCID) injection 20 mg (20 mg IntraVENous Given 06/03/19 1636)   sodium chloride 0.9 % bolus infusion 1,000 mL (0 mL IntraVENous IV Completed 06/03/19 1755)     CBC remarkable for normal white count hemoglobin 12.8 normal platelets  Urine dip is negative  CMP and lipase are normal  Beta hCG is 18,365  Blood type O+    Wet prep shows clue cells     Pregnancy test positive  Ultrasound shows a gestational sac, with a yolk sac but no embryo.  Normal dopplers to ovaries.    Patient given fluids Pepcid Zofran    I was able to speak to on-call for Dr. sample Dr. Einar Gip by phone we reviewed patient's exam diagnostics and ultrasound.  She was comfortable with the patient being discharged to follow-up in the office for repeat ultrasound.  She also recommended that she would typically treat the bacterial vaginosis with oral clindamycin or MetroGel.  Unfortunately the ultrasound was delayed in being done prolonging the length of stay.    I had started to do patient's discharge but had to answer a physician phone call and when I went over to the patient's room to go over results I was told by staff that patient had left and did not want longer for provider to come over. Therefore she did not receive results. I did try both of the numbers on the face sheet.  The first went to a voicemail without identifying information. The second was to an office answering machine of some sort.  So I was unable to contact the patient by phone to go over her results.    Final Diagnosis       ICD-10-CM ICD-9-CM   1. Early stage of pregnancy  Z34.90 V22.2   2. Abdominal cramping  R10.9 789.00   3. BV (bacterial vaginosis)  N76.0 616.10    B96.89 041.9       Disposition   *Eloped from department    Maylon Cos, MD  June 04, 2019      Dragon dictation software was used for portions of this report. Unintended errors in transcription may occur.     My signature above authenticates this document and my orders, the final ??  diagnosis (es), discharge prescription (s), and instructions in the Epic ??  record.  If you have any questions please contact [phone].  ??  Nursing notes have been reviewed by the physician/ advanced practice ??  Clinician.

## 2019-06-03 NOTE — ED Notes (Signed)
No discharge instructions provided. Pt eloped from room stating "im tired of waiting and I am hungry"

## 2019-06-03 NOTE — ED Notes (Signed)
+  home preg test 2 days ago c/o abdominal pain and vaginal discharge

## 2019-06-03 NOTE — ED Notes (Signed)
 No discharge instructions provided. Pt eloped from room stating im tired of waiting and I am hungry

## 2019-06-03 NOTE — ED Notes (Signed)
POC urine ran, blanket given and lights turned down for pt comfort, no other needs at present time.

## 2019-06-03 NOTE — ED Notes (Signed)
Pt transported to [place] via transport tech.

## 2019-06-03 NOTE — ED Provider Notes (Signed)
[place]  Emergency Department Treatment Report    Patient: [name] Allen Age: 23 y.o. Sex: female    Date of Birth: [birth year] Admit Date: 06/03/2019 PCP: Eliott Nine, MD   MRN: [id number]  CSN: [id number]     Room: ER50/ER50 Time Dictated: 7:29 PM         Chief Complaint   *Pregnancy problem  History of Present Illness   23 y.o. female who states she is pregnant and her last menstrual period was June 27 she typically sees Dr. sample for GYN   she is a G2, P1 with twins, she said she took a pregnancy test 2 days ago and it was positive.  She said 2 days ago she started getting cramping in her pelvis but also all over her abdomen kind of in her left upper quadrant as well they come and go nothing makes them better or worse she is not having vaginal bleeding she is nauseous but has not vomited or had diarrhea or taken anything for her symptoms.  She was concerned and wanted to come to the ER.  No travel or contact with anyone with coronavirus.    Review of Systems   Review of Systems   Constitutional: Negative for fever.   HENT: Negative for congestion and sore throat.    Eyes: Negative for redness.   Respiratory: Negative for cough and shortness of breath.    Cardiovascular: Negative for chest pain.   Gastrointestinal: Positive for nausea. Negative for diarrhea and vomiting.        Cramping   Genitourinary: Negative for dysuria.   Musculoskeletal: Negative for falls.   Skin: Negative for rash.   Neurological: Negative for loss of consciousness and headaches.        Past Medical/Surgical History     Past Medical History:   Diagnosis Date   ??? Asthma      History reviewed. No pertinent surgical history.  *Patient denies any medical or surgical history  Social History     Social History     Socioeconomic History   ??? Marital status: SINGLE     Spouse name: Not on file   ??? Number of children: Not on file   ??? Years of education: Not on file   ??? Highest education level: Not on file   Tobacco Use    ??? Smoking status: Current Every Day Smoker     Packs/day: 1.00   ??? Smokeless tobacco: Never Used   Substance and Sexual Activity   ??? Alcohol use: Yes     Comment: every other day   ??? Drug use: Yes     Frequency: 7.0 times per week     Types: Marijuana   ??? Sexual activity: Yes     Partners: Male     Birth control/protection: Condom        Family History     Family History   Problem Relation Age of Onset   ??? Diabetes Maternal Grandmother    ??? Diabetes Paternal Grandfather         Current Medications   She is not taking medicines  None     Allergies   No Known Allergies  Physical Exam     ED Triage Vitals   Enc Vitals Group      BP 06/03/19 1253 (!) 135/103      Pulse (Heart Rate) 06/03/19 1253 92      Resp Rate 06/03/19 1253 (!) 1  Temp 06/03/19 1253 98.4 ??F (36.9 ??C)      Temp src --       O2 Sat (%) 06/03/19 1253 97 %      Weight 06/03/19 1545 102 lb      Height 06/03/19 1545 5' 4"       Head Circumference --       Peak Flow --       Pain Score --       Pain Loc --       Pain Edu? --       Excl. in [place]? --        Physical Exam  HENT:      Head: Normocephalic.   Eyes:      General: No scleral icterus.  Cardiovascular:      Rate and Rhythm: Normal rate and regular rhythm.   Pulmonary:      Effort: Pulmonary effort is normal.      Breath sounds: Normal breath sounds.   Abdominal:      Comments: Soft and the abdomen is completely nontender   Genitourinary:     Comments: There is normal external female genitalia there is scant white discharge in the vault no bleeding, there is no cervical motion tenderness or reproducible pelvic or adnexal tenderness  Skin:     General: Skin is warm and dry.   Neurological:      General: No focal deficit present.      Mental Status: She is alert and oriented to person, place, and time.   Psychiatric:         Mood and Affect: Mood normal.          Impression and Management Plan   *Patient is presenting with cramping her abdomen is completely benign no bleeding noted on exam I have  ordered labs hCG and urine.  She will have an ultrasound.    Initial blood pressure 135/103 recheck 110/79 pulse of 68 normal sats  Diagnostic   Lab:   Recent Results (from the past 12 hour(s))   WET PREP    Collection Time: 06/03/19  4:01 PM    Specimen: Vagina; Genital   Result Value Ref Range    Wet prep        No Yeast or motile Trichomonas seen  Clue Cells Seen     CBC WITH AUTOMATED DIFF    Collection Time: 06/03/19  4:33 PM   Result Value Ref Range    WBC 6.3 4.0 - 11.0 1000/mm3    RBC 4.79 3.60 - 5.20 M/uL    HGB 12.8 (L) 13.0 - 17.2 gm/dl    HCT 40.6 37.0 - 50.0 %    MCV 84.8 80.0 - 98.0 fL    MCH 26.7 25.4 - 34.6 pg    MCHC 31.5 30.0 - 36.0 gm/dl    PLATELET 244 140 - 450 1000/mm3    MPV 10.9 (H) 6.0 - 10.0 fL    RDW-SD 48.4 (H) 36.4 - 46.3      NRBC 0 0 - 0      IMMATURE GRANULOCYTES 0.2 0.0 - 3.0 %    NEUTROPHILS 45.3 34 - 64 %    LYMPHOCYTES 40.0 28 - 48 %    MONOCYTES 10.4 1 - 13 %    EOSINOPHILS 3.6 0 - 5 %    BASOPHILS 0.5 0 - 3 %   METABOLIC PANEL, COMPREHENSIVE    Collection Time: 06/03/19  4:33 PM   Result Value Ref  Range    Sodium 137 136 - 145 mEq/L    Potassium 3.7 3.5 - 5.1 mEq/L    Chloride 106 98 - 107 mEq/L    CO2 26 21 - 32 mEq/L    Glucose 73 (L) 74 - 106 mg/dl    BUN 10 7 - 25 mg/dl    Creatinine 0.9 0.6 - 1.3 mg/dl    GFR est AA >60.0      GFR est non-AA >60      Calcium 9.5 8.5 - 10.1 mg/dl    AST (SGOT) 26 15 - 37 U/L    ALT (SGPT) 33 12 - 78 U/L    Alk. phosphatase 71 45 - 117 U/L    Bilirubin, total 0.3 0.2 - 1.0 mg/dl    Protein, total 8.4 (H) 6.4 - 8.2 gm/dl    Albumin 3.8 3.4 - 5.0 gm/dl    Anion gap 5 5 - 15 mmol/L   LIPASE    Collection Time: 06/03/19  4:33 PM   Result Value Ref Range    Lipase 164 73 - 393 U/L   BLOOD TYPE, (ABO+RH)    Collection Time: 06/03/19  4:33 PM   Result Value Ref Range    ABO/Rh(D) O Rh Positive     BETA HCG, QT    Collection Time: 06/03/19  4:33 PM   Result Value Ref Range    HCG, beta, QT 18,365 mIU/ml   POC HCG,URINE    Collection Time: 06/03/19   4:52 PM   Result Value Ref Range    HCG urine, QL positive (A) NEGATIVE,Negative,negative     POC URINE MACROSCOPIC    Collection Time: 06/03/19  5:05 PM   Result Value Ref Range    Glucose Negative NEGATIVE,Negative mg/dl    Bilirubin Negative NEGATIVE,Negative      Ketone Negative NEGATIVE,Negative mg/dl    Specific gravity 1.020 1.005 - 1.030      Blood Negative NEGATIVE,Negative      pH (UA) 7.0 5 - 9      Protein Negative NEGATIVE,Negative mg/dl    Urobilinogen 1.0 0.0 - 1.0 EU/dl    Nitrites Negative NEGATIVE,Negative      Leukocyte Esterase Negative NEGATIVE,Negative      Color Yellow      Appearance Clear     ANTIBODY SCREEN    Collection Time: 06/03/19  5:26 PM   Result Value Ref Range    Antibody screen NEG         Imaging:    [place] Uts Transvaginal Ob    Result Date: 06/03/2019  EARLY OBSTETRIC ULTRASOUND: HISTORY: pelvic pain with doppler; Size, dates and viability. TECHNIQUE: Transvaginal imaging was performed with grayscale and color Doppler. Spectral Doppler evaluation of the ovaries was also performed. FINDINGS: The uterus measures 9.9 x 5.2 x 6.2 cm. The right ovary measures 3.5 x 2.7 x 1.9 cm. The left ovary measures 2.8 x 2.8 x 2.2 cm. A corpus luteum on the right ovary measures 1.8 x 1.3 x 1.9 cm. Normal arterial and venous spectral Doppler waveforms are identified in both ovaries. There is a single gestational sac within the uterus with mean sac diameter of 1.71 cm. This corresponds to a 6 weeks 4 day gestation. There is a normal appearing yolk sac. No embryo is identified. There is no evidence of subchorionic bleed.     IMPRESSION: Single intrauterine gestation of uncertain viability with no embryo identified. Follow-up with hCG levels and ultrasound in 7-10 days is recommended.  ED Course/Medical Decision Making        Medications   sodium chloride (NS) flush 5-10 mL (10 mL IntraVENous Given 06/03/19 1559)   ondansetron (ZOFRAN) injection 4 mg (4 mg IntraVENous Given 06/03/19 1636)   famotidine  (PF) (PEPCID) injection 20 mg (20 mg IntraVENous Given 06/03/19 1636)   sodium chloride 0.9 % bolus infusion 1,000 mL (0 mL IntraVENous IV Completed 06/03/19 1755)     CBC remarkable for normal white count hemoglobin 12.8 normal platelets  Urine dip is negative  CMP and lipase are normal  Beta hCG is 18,365  Blood type O+    Wet prep shows clue cells     Pregnancy test positive  Ultrasound shows a gestational sac, with a yolk sac but no embryo.  Normal dopplers to ovaries.    Patient given fluids Pepcid Zofran    I was able to speak to on-call for Dr. sample Dr. Einar Gip by phone we reviewed patient's exam diagnostics and ultrasound.  She was comfortable with the patient being discharged to follow-up in the office for repeat ultrasound.  She also recommended that she would typically treat the bacterial vaginosis with oral clindamycin or MetroGel.  Unfortunately the ultrasound was delayed in being done prolonging the length of stay.    I had started to do patient's discharge but had to answer a physician phone call and when I went over to the patient's room to go over results I was told by staff that patient had left and did not want longer for provider to come over. Therefore she did not receive results. I did try both of the numbers on the face sheet.  The first went to a voicemail without identifying information. The second was to an office answering machine of some sort.  So I was unable to contact the patient by phone to go over her results.    Final Diagnosis       ICD-10-CM ICD-9-CM   1. Early stage of pregnancy  Z34.90 V22.2   2. Abdominal cramping  R10.9 789.00   3. BV (bacterial vaginosis)  N76.0 616.10    B96.89 041.9       Disposition   *Eloped from department    Maylon Cos, MD  June 04, 2019      Dragon dictation software was used for portions of this report. Unintended errors in transcription may occur.    My signature above authenticates this document and my orders, the final ??  diagnosis (es),  discharge prescription (s), and instructions in the Epic ??  record.  If you have any questions please contact [phone].  ??  Nursing notes have been reviewed by the physician/ advanced practice ??  Clinician.

## 2019-06-03 NOTE — ED Notes (Signed)
I called lab twice about the patient's urinalysis and hCG that was collected lab said they did not have it twice so we will have patient give a urine and dip it in the ER

## 2019-06-03 NOTE — ED Notes (Signed)
PT returned from ultrasound via transport tech  Denies any further needs at this time

## 2019-06-04 LAB — CT/GC DNA
CHLAMYDIA TRACHOMATIS DNA, CTDNA: NOT DETECTED
CHLAMYDIA TRACHOMATIS DNA: NOT DETECTED
NEISSERIA GONORRHOEAE DNA, GCDNA: NOT DETECTED
NEISSERIA GONORRHOEAE DNA: NOT DETECTED

## 2019-06-17 ENCOUNTER — Emergency Department: Admit: 2019-06-17 | Payer: PRIVATE HEALTH INSURANCE | Primary: Pediatrics

## 2019-06-17 ENCOUNTER — Inpatient Hospital Stay
Admit: 2019-06-17 | Discharge: 2019-06-17 | Disposition: A | Discharge status: Home / Self Care | Payer: PRIVATE HEALTH INSURANCE | Attending: Emergency Medicine

## 2019-06-17 DIAGNOSIS — O208 Other hemorrhage in early pregnancy: Secondary | ICD-10-CM

## 2019-06-17 LAB — POC URINE MACROSCOPIC
Bilirubin, Urine: NEGATIVE
Bilirubin: NEGATIVE
Blood, Urine: NEGATIVE
Blood: NEGATIVE
Glucose, Ur: NEGATIVE mg/dl
Glucose: NEGATIVE mg/dl
Ketone: NEGATIVE mg/dl
Ketones, Urine: NEGATIVE mg/dl
Nitrite, Urine: NEGATIVE
Nitrites: NEGATIVE
Protein, UA: NEGATIVE mg/dl
Protein: NEGATIVE mg/dl
Specific Gravity, UA: 1.025 (ref 1.005–1.030)
Specific gravity: 1.025 (ref 1.005–1.030)
Urobilinogen, UA, POCT: 1 EU/dl (ref 0.0–1.0)
Urobilinogen: 1 EU/dl (ref 0.0–1.0)
pH (UA): 6 (ref 5–9)
pH, UA: 6 (ref 5–9)

## 2019-06-17 LAB — BETA HCG, QT
HCG, BETA, HCGTLT: 95021 m[IU]/mL
HCG, beta, QT: 95021 m[IU]/mL

## 2019-06-17 LAB — POC URINE MICROSCOPIC

## 2019-06-17 LAB — WET PREP
Wet Prep: NONE SEEN
Wet prep: NONE SEEN

## 2019-06-17 LAB — POC HCG,URINE
HCG urine, QL: POSITIVE — AB
Pregnancy Test(Urn): POSITIVE — AB

## 2019-06-17 MED ORDER — SODIUM CHLORIDE 0.9 % IJ SYRG
Freq: Once | INTRAMUSCULAR | Status: DC
Start: 2019-06-17 — End: 2019-06-17

## 2019-06-17 MED ORDER — CLOTRIMAZOLE 1 % VAGINAL CREAM
1 % | Freq: Every evening | VAGINAL | 0 refills | Status: AC
Start: 2019-06-17 — End: 2019-06-24

## 2019-06-17 NOTE — ED Provider Notes (Signed)
[place]  Emergency Department Treatment Report    Patient: [name] Allen Age: 23 y.o. Sex: female    Date of Birth: 1996-02-23 Admit Date: 06/17/2019 PCP: Eliott Nine, MD   MRN: [id number]  CSN: [id number]     Room: ER37/ER37 Time Dictated: 10:33 AM      Dragon medical dictation software was used for portions of this report.  Unintended transcription errors may occur.    Chief Complaint   Pregnancy, vaginal bleeding, vaginal discharge  History of Present Illness   24 y.o. female presents today for evaluation of pelvic pain and vaginal bleeding.  She states that she is pregnant, she is not totally certain how far along she is, last menstrual period was the beginning of July.  Has had some pelvic cramping and bleeding on and off now for a few weeks.  She was seen here on the sixth, had an ultrasound that was nondiagnostic, and was told to follow-up with her primary obstetrician, she says that she cannot get into the office for another 2 weeks.  Given that she is having pain and bleeding, they advised her to come to the ER for evaluation.  She also has some discharge, she describes it as whitish.  She is sexually active.  Does have a history of gonorrhea and chlamydia, she does not think that she has had at this point in time.  Denies dysuria, frequency, urgency, has no flank pain.  Denies any perianal or rectal pain.    Review of Systems   Review of Systems   Constitutional: Negative for chills, diaphoresis, fever and weight loss.   HENT: Negative for congestion and sore throat.    Respiratory: Negative for cough, sputum production, shortness of breath and wheezing.    Cardiovascular: Negative for chest pain, palpitations, orthopnea and leg swelling.   Gastrointestinal: Negative for abdominal pain, diarrhea, nausea and vomiting.   Genitourinary: Negative for dysuria, frequency and urgency.   Musculoskeletal: Negative for back pain and falls.   Skin: Negative for rash.    Neurological: Negative for dizziness and headaches.   Endo/Heme/Allergies: Does not bruise/bleed easily.   Psychiatric/Behavioral: Negative for substance abuse.       Past Medical/Surgical History     Past Medical History:   Diagnosis Date   ??? Asthma      No past surgical history on file.    Social History     Social History     Socioeconomic History   ??? Marital status: SINGLE     Spouse name: Not on file   ??? Number of children: Not on file   ??? Years of education: Not on file   ??? Highest education level: Not on file   Tobacco Use   ??? Smoking status: Current Every Day Smoker     Packs/day: 1.00   ??? Smokeless tobacco: Never Used   Substance and Sexual Activity   ??? Alcohol use: Yes     Comment: every other day   ??? Drug use: Yes     Frequency: 7.0 times per week     Types: Marijuana   ??? Sexual activity: Yes     Partners: Male     Birth control/protection: Condom       Family History     Family History   Problem Relation Age of Onset   ??? Diabetes Maternal Grandmother    ??? Diabetes Paternal Grandfather        Current Medications  Current Facility-Administered Medications   Medication Dose Route Frequency Provider Last Rate Last Dose   ??? sodium chloride (NS) flush 5-10 mL  5-10 mL IntraVENous ONCE Marky Buresh, Sherie DonJonathan A, MD           Allergies   No Known Allergies    Physical Exam     Visit Vitals  BP 114/59 (BP 1 Location: Right arm, BP Patient Position: At rest)   Temp 98.5 ??F (36.9 ??C)   Resp 17   Ht 5\' 4"  (1.626 m)   SpO2 100%   BMI 17.51 kg/m??     Physical Exam  Constitutional:       General: She is not in acute distress.     Appearance: She is not diaphoretic.   HENT:      Head: Normocephalic and atraumatic.   Eyes:      General:         Right eye: No discharge.         Left eye: No discharge.      Conjunctiva/sclera: Conjunctivae normal.      Pupils: Pupils are equal, round, and reactive to light.   Neck:      Musculoskeletal: Normal range of motion and neck supple.      Trachea: No tracheal deviation.    Cardiovascular:      Rate and Rhythm: Normal rate.      Heart sounds: Normal heart sounds. No murmur. No friction rub. No gallop.    Pulmonary:      Effort: Pulmonary effort is normal. No respiratory distress.      Breath sounds: Normal breath sounds. No stridor. No wheezing or rales.   Abdominal:      General: Bowel sounds are normal. There is no distension.      Palpations: Abdomen is soft. There is no mass.      Tenderness: There is no abdominal tenderness. There is no guarding or rebound.   Genitourinary:     Comments: Pelvic exam performed with nurse Maryam at the bedside reveals normal external female genitalia, speculum examination reveals a moderate amount of whitish discharge with no cervical motion tenderness, no bleeding, no adnexal pain.  Musculoskeletal: Normal range of motion.         General: No deformity.   Skin:     General: Skin is warm and dry.      Coloration: Skin is not pale.      Findings: No erythema or rash.   Neurological:      Mental Status: She is alert.   Psychiatric:         Mood and Affect: Affect normal.         Impression and Management Plan   [age] year old female here today with vaginal bleeding and pelvic pain, known to be pregnant, he gestational age is not totally clear, was post to see her obstetrician since her last visit but has not been able to get in, will repeat beta quant, will repeat ultrasound, send wet prep, gonorrhea, chlamydia.   Diagnostic Studies   Lab:   [facility] Transvaginal Ob    Result Date: 06/17/2019  Indication: Bleeding, pain, increased quantitative beta Transvaginal images show uterus to be enlarged at 10.7 cm length. Gestational sac present in endometrial cavity at 2.3 cm corresponding to 7 weeks 3 days with fetal pole measuring 1.5 cm at 7 weeks 6 days.  [facility] Yolk sac visualized. Fetal heart rate 152 bpm. Ovaries normal in size with a 1.6 cm right-sided corpus  luteum. Minimal free fluid. Subchorionic fluid  consistent with hemorrhage present 10 x 4 x 5 mm.     IMPRESSION: Single live intrauterine gestation at 7 weeks 5 days with small subchorionic hemorrhage. Minimal free fluid, nonspecific.     [facility] Transvaginal Ob    Result Date: 06/03/2019  EARLY OBSTETRIC ULTRASOUND: HISTORY: pelvic pain with doppler; Size, dates and viability. TECHNIQUE: Transvaginal imaging was performed with grayscale and color Doppler. Spectral Doppler evaluation of the ovaries was also performed. FINDINGS: The uterus measures 9.9 x 5.2 x 6.2 cm. The right ovary measures 3.5 x 2.7 x 1.9 cm. The left ovary measures 2.8 x 2.8 x 2.2 cm. A corpus luteum on the right ovary measures 1.8 x 1.3 x 1.9 cm. Normal arterial and venous spectral Doppler waveforms are identified in both ovaries. There is a single gestational sac within the uterus with mean sac diameter of 1.71 cm. This corresponds to a 6 weeks 4 day gestation. There is a normal appearing yolk sac. No embryo is identified. There is no evidence of subchorionic bleed.     IMPRESSION: Single intrauterine gestation of uncertain viability with no embryo identified. Follow-up with hCG levels and ultrasound in 7-10 days is recommended.       Recent Results (from the past 24 hour(s))   BETA HCG, QT    Collection Time: 06/17/19 11:00 AM   Result Value Ref Range    HCG, beta, QT 95,021 mIU/ml   POC HCG,URINE    Collection Time: 06/17/19 11:06 AM   Result Value Ref Range    HCG urine, QL positive (A) NEGATIVE,Negative,negative     POC URINE MACROSCOPIC    Collection Time: 06/17/19 11:19 AM   Result Value Ref Range    Glucose Negative NEGATIVE,Negative mg/dl    Bilirubin Negative NEGATIVE,Negative      Ketone Negative NEGATIVE,Negative mg/dl    Specific gravity 1.6101.025 1.005 - 1.030      Blood Negative NEGATIVE,Negative      pH (UA) 6.0 5 - 9      Protein Negative NEGATIVE,Negative mg/dl    Urobilinogen 1.0 0.0 - 1.0 EU/dl    Nitrites Negative NEGATIVE,Negative       Leukocyte Esterase Small (A) NEGATIVE,Negative      Color Yellow      Appearance Clear     POC URINE MICROSCOPIC    Collection Time: 06/17/19 11:19 AM   Result Value Ref Range    Epithelial cells, squamous 1-4 /LPF    WBC 1-4 /HPF    Bacteria OCCASIONAL /HPF   WET PREP    Collection Time: 06/17/19 11:25 AM    Specimen: Cervical; Genital   Result Value Ref Range    Wet prep        No Motile Trichomonas Seen  No Clue Cells Seen  Fungal elements seen               Medical Decision Making/ED Course   Ultrasound shows viable IUP with fetal heart tones, there is a subchorionic hemorrhage, wet prep shows fungal elements.  We will start her on intravaginal antifungals, recommend pelvic rest for her vaginal bleeding with subchorionic hemorrhage, advised OB follow-up, advised return precautions for impressive pain, excessive bleeding, or any concerning issues.  Gonorrhea and committee are pending at time of discharge, patient is aware, will defer treatment until those return.           Final Diagnosis     Encounter Diagnoses     ICD-10-CM  ICD-9-CM   1. Threatened miscarriage  O20.0 640.00   2. Subchorionic hematoma in first trimester, single or unspecified fetus  O41.8X10 656.83    O46.8X1    3. Candidal vulvovaginitis  B37.3 112.1       Disposition   Discharge to home    Wynelle BourgeoisJonathan A Dashley Monts, MD  June 17, 2019    My signature above authenticates this document and my orders, the final    diagnosis (es), discharge prescription (s), and instructions in the Epic    record.  If you have any questions please contact [phone].

## 2019-06-17 NOTE — ED Triage Notes (Signed)
Pt states unsure how far along she is, last period was around June 23rd. Pt reports abdominal cramps, vaginal bleeding.

## 2019-06-17 NOTE — ED Notes (Signed)
1:44 PM  06/17/19     Discharge instructions given to [name] Allen (name) with verbalization of understanding. Patient accompanied by patient.  Patient discharged with the following prescriptions   Current Discharge Medication List      START taking these medications    Details   clotrimazole (MYCELEX) 1 % vaginal cream Insert 1 Applicator into vagina nightly for 7 days.  Qty: 20 g, Refills: 0          . Patient discharged to Home (destination).      John L Emilio, RN

## 2019-06-17 NOTE — ED Notes (Signed)
Pt states unsure how far along she is, last period was around June 23rd. Pt reports abdominal cramps, vaginal bleeding.

## 2019-06-17 NOTE — ED Provider Notes (Signed)
[place]  Emergency Department Treatment Report    Patient: [name] Allen Age: 23 y.o. Sex: female    Date of Birth: [birth year] Admit Date: 06/17/2019 PCP: Eliott Nine, MD   MRN: [id number]  CSN: [id number]     Room: ER37/ER37 Time Dictated: 10:33 AM      Dragon medical dictation software was used for portions of this report.  Unintended transcription errors may occur.    Chief Complaint   Pregnancy, vaginal bleeding, vaginal discharge  History of Present Illness   23 y.o. female presents today for evaluation of pelvic pain and vaginal bleeding.  She states that she is pregnant, she is not totally certain how far along she is, last menstrual period was the beginning of July.  Has had some pelvic cramping and bleeding on and off now for a few weeks.  She was seen here on the sixth, had an ultrasound that was nondiagnostic, and was told to follow-up with her primary obstetrician, she says that she cannot get into the office for another 2 weeks.  Given that she is having pain and bleeding, they advised her to come to the ER for evaluation.  She also has some discharge, she describes it as whitish.  She is sexually active.  Does have a history of gonorrhea and chlamydia, she does not think that she has had at this point in time.  Denies dysuria, frequency, urgency, has no flank pain.  Denies any perianal or rectal pain.    Review of Systems   Review of Systems   Constitutional: Negative for chills, diaphoresis, fever and weight loss.   HENT: Negative for congestion and sore throat.    Respiratory: Negative for cough, sputum production, shortness of breath and wheezing.    Cardiovascular: Negative for chest pain, palpitations, orthopnea and leg swelling.   Gastrointestinal: Negative for abdominal pain, diarrhea, nausea and vomiting.   Genitourinary: Negative for dysuria, frequency and urgency.   Musculoskeletal: Negative for back pain and falls.   Skin: Negative for rash.   Neurological:  Negative for dizziness and headaches.   Endo/Heme/Allergies: Does not bruise/bleed easily.   Psychiatric/Behavioral: Negative for substance abuse.       Past Medical/Surgical History     Past Medical History:   Diagnosis Date   ??? Asthma      No past surgical history on file.    Social History     Social History     Socioeconomic History   ??? Marital status: SINGLE     Spouse name: Not on file   ??? Number of children: Not on file   ??? Years of education: Not on file   ??? Highest education level: Not on file   Tobacco Use   ??? Smoking status: Current Every Day Smoker     Packs/day: 1.00   ??? Smokeless tobacco: Never Used   Substance and Sexual Activity   ??? Alcohol use: Yes     Comment: every other day   ??? Drug use: Yes     Frequency: 7.0 times per week     Types: Marijuana   ??? Sexual activity: Yes     Partners: Male     Birth control/protection: Condom       Family History     Family History   Problem Relation Age of Onset   ??? Diabetes Maternal Grandmother    ??? Diabetes Paternal Grandfather        Current Medications  Current Facility-Administered Medications   Medication Dose Route Frequency Provider Last Rate Last Dose   ??? sodium chloride (NS) flush 5-10 mL  5-10 mL IntraVENous ONCE Kyion Gautier, Sherie DonJonathan A, MD           Allergies   No Known Allergies    Physical Exam     Visit Vitals  BP 114/59 (BP 1 Location: Right arm, BP Patient Position: At rest)   Temp 98.5 ??F (36.9 ??C)   Resp 17   Ht 5\' 4"  (1.626 m)   SpO2 100%   BMI 17.51 kg/m??     Physical Exam  Constitutional:       General: She is not in acute distress.     Appearance: She is not diaphoretic.   HENT:      Head: Normocephalic and atraumatic.   Eyes:      General:         Right eye: No discharge.         Left eye: No discharge.      Conjunctiva/sclera: Conjunctivae normal.      Pupils: Pupils are equal, round, and reactive to light.   Neck:      Musculoskeletal: Normal range of motion and neck supple.      Trachea: No tracheal deviation.   Cardiovascular:      Rate  and Rhythm: Normal rate.      Heart sounds: Normal heart sounds. No murmur. No friction rub. No gallop.    Pulmonary:      Effort: Pulmonary effort is normal. No respiratory distress.      Breath sounds: Normal breath sounds. No stridor. No wheezing or rales.   Abdominal:      General: Bowel sounds are normal. There is no distension.      Palpations: Abdomen is soft. There is no mass.      Tenderness: There is no abdominal tenderness. There is no guarding or rebound.   Genitourinary:     Comments: Pelvic exam performed with nurse Maryam at the bedside reveals normal external female genitalia, speculum examination reveals a moderate amount of whitish discharge with no cervical motion tenderness, no bleeding, no adnexal pain.  Musculoskeletal: Normal range of motion.         General: No deformity.   Skin:     General: Skin is warm and dry.      Coloration: Skin is not pale.      Findings: No erythema or rash.   Neurological:      Mental Status: She is alert.   Psychiatric:         Mood and Affect: Affect normal.         Impression and Management Plan   23 year old female here today with vaginal bleeding and pelvic pain, known to be pregnant, he gestational age is not totally clear, was post to see her obstetrician since her last visit but has not been able to get in, will repeat beta quant, will repeat ultrasound, send wet prep, gonorrhea, chlamydia.   Diagnostic Studies   Lab:   [facility] Transvaginal Ob    Result Date: 06/17/2019  Indication: Bleeding, pain, increased quantitative beta Transvaginal images show uterus to be enlarged at 10.7 cm length. Gestational sac present in endometrial cavity at 2.3 cm corresponding to 7 weeks 3 days with fetal pole measuring 1.5 cm at 7 weeks 6 days[facility] Yolk sac visualized. Fetal heart rate 152 bpm. Ovaries normal in size with a 1.6 cm right-sided corpus  luteum. Minimal free fluid. Subchorionic fluid consistent with hemorrhage present 10 x 4 x 5 mm.     IMPRESSION: Single  live intrauterine gestation at 7 weeks 5 days with small subchorionic hemorrhage. Minimal free fluid, nonspecific.     [facility] Transvaginal Ob    Result Date: 06/03/2019  EARLY OBSTETRIC ULTRASOUND: HISTORY: pelvic pain with doppler; Size, dates and viability. TECHNIQUE: Transvaginal imaging was performed with grayscale and color Doppler. Spectral Doppler evaluation of the ovaries was also performed. FINDINGS: The uterus measures 9.9 x 5.2 x 6.2 cm. The right ovary measures 3.5 x 2.7 x 1.9 cm. The left ovary measures 2.8 x 2.8 x 2.2 cm. A corpus luteum on the right ovary measures 1.8 x 1.3 x 1.9 cm. Normal arterial and venous spectral Doppler waveforms are identified in both ovaries. There is a single gestational sac within the uterus with mean sac diameter of 1.71 cm. This corresponds to a 6 weeks 4 day gestation. There is a normal appearing yolk sac. No embryo is identified. There is no evidence of subchorionic bleed.     IMPRESSION: Single intrauterine gestation of uncertain viability with no embryo identified. Follow-up with hCG levels and ultrasound in 7-10 days is recommended.       Recent Results (from the past 24 hour(s))   BETA HCG, QT    Collection Time: 06/17/19 11:00 AM   Result Value Ref Range    HCG, beta, QT 95,021 mIU/ml   POC HCG,URINE    Collection Time: 06/17/19 11:06 AM   Result Value Ref Range    HCG urine, QL positive (A) NEGATIVE,Negative,negative     POC URINE MACROSCOPIC    Collection Time: 06/17/19 11:19 AM   Result Value Ref Range    Glucose Negative NEGATIVE,Negative mg/dl    Bilirubin Negative NEGATIVE,Negative      Ketone Negative NEGATIVE,Negative mg/dl    Specific gravity 4.5401.025 1.005 - 1.030      Blood Negative NEGATIVE,Negative      pH (UA) 6.0 5 - 9      Protein Negative NEGATIVE,Negative mg/dl    Urobilinogen 1.0 0.0 - 1.0 EU/dl    Nitrites Negative NEGATIVE,Negative      Leukocyte Esterase Small (A) NEGATIVE,Negative      Color Yellow      Appearance Clear     POC URINE MICROSCOPIC     Collection Time: 06/17/19 11:19 AM   Result Value Ref Range    Epithelial cells, squamous 1-4 /LPF    WBC 1-4 /HPF    Bacteria OCCASIONAL /HPF   WET PREP    Collection Time: 06/17/19 11:25 AM    Specimen: Cervical; Genital   Result Value Ref Range    Wet prep        No Motile Trichomonas Seen  No Clue Cells Seen  Fungal elements seen               Medical Decision Making/ED Course   Ultrasound shows viable IUP with fetal heart tones, there is a subchorionic hemorrhage, wet prep shows fungal elements.  We will start her on intravaginal antifungals, recommend pelvic rest for her vaginal bleeding with subchorionic hemorrhage, advised OB follow-up, advised return precautions for impressive pain, excessive bleeding, or any concerning issues.  Gonorrhea and committee are pending at time of discharge, patient is aware, will defer treatment until those return.           Final Diagnosis     Encounter Diagnoses     ICD-10-CM  ICD-9-CM   1. Threatened miscarriage  O20.0 640.00   2. Subchorionic hematoma in first trimester, single or unspecified fetus  O41.8X10 656.83    O46.8X1    3. Candidal vulvovaginitis  B37.3 112.1       Disposition   Discharge to home    Wynelle BourgeoisJonathan A Dashley Monts, MD  June 17, 2019    My signature above authenticates this document and my orders, the final    diagnosis (es), discharge prescription (s), and instructions in the Epic    record.  If you have any questions please contact [phone].

## 2019-06-17 NOTE — ED Notes (Signed)
1:44 PM  06/17/19     Discharge instructions given to [name] Allen (name) with verbalization of understanding. Patient accompanied by patient.  Patient discharged with the following prescriptions   Current Discharge Medication List      START taking these medications    Details   clotrimazole (MYCELEX) 1 % vaginal cream Insert 1 Applicator into vagina nightly for 7 days.  Qty: 20 g, Refills: 0          . Patient discharged to Home (destination).      Tacy Learn, RN

## 2019-06-30 LAB — CT/GC DNA
CHLAMYDIA TRACHOMATIS DNA, CTDNA: NOT DETECTED
CHLAMYDIA TRACHOMATIS DNA: NOT DETECTED
NEISSERIA GONORRHOEAE DNA, GCDNA: NOT DETECTED
NEISSERIA GONORRHOEAE DNA: NOT DETECTED

## 2019-11-30 ENCOUNTER — Emergency Department: Admit: 2019-12-01 | Payer: PRIVATE HEALTH INSURANCE | Primary: Pediatrics

## 2019-11-30 ENCOUNTER — Inpatient Hospital Stay
Admit: 2019-11-30 | Discharge: 2019-12-01 | Disposition: A | Discharge status: Home / Self Care | Payer: PRIVATE HEALTH INSURANCE | Attending: Emergency Medicine

## 2019-11-30 DIAGNOSIS — R079 Chest pain, unspecified: Secondary | ICD-10-CM

## 2019-11-30 NOTE — ED Triage Notes (Addendum)
Hx of asthma.    Here with c/o pain to chest, tingling to hands. Also c/o headaches and shortness of breath. Symptoms x3 weeks. Symptoms come and go, headaches at night.     Also requesting pelvic exam, states the last time she had gonorrhea she felt the same way. Reports she [facility] and they never treated her for it.

## 2019-11-30 NOTE — ED Provider Notes (Signed)
[facility]  Emergency Department Treatment Report    Patient: [name] Allen Age: 24 y.o. Sex: female    Date of Birth: Apr 14, 1996 Admit Date: 11/30/2019 PCP: Eliott Nine, MD   MRN: [id number]  CSN: [id number]  Attending: Terald Sleeper, MD   Room: ER31/ER31 Time Dictated: 11:10 PM APP: [facility]     Chief Complaint   Chief Complaint   Patient presents with   ??? Headache   ??? Chest Pain   ??? Vaginal Discharge       History of Present Illness   24 y.o. female with history of asthma and depression presents to the emergency department concern for STD.  Patient says she has been having some vaginal discharge and is pretty sure she contracted gonorrhea or chlamydia from her previous partner.  She denies any abdominal pain or cramping.  She denies any nausea or vomiting.    Patient also mentioned that she has been having headaches for the last 3 weeks as well as chest pain and shortness of breath.  Patient said the headache seemed to improve with ibuprofen.  She is not currently having a headache.  Patient said the chest pain and shortness of breath occurs when she is going to bed usually at nighttime and then resolves on its own.  She denies any family history of heart disease.  She does smoke 1/2 pack of cigarettes daily as well as marijuana daily.  She denies any hemoptysis, recent travel or surgery, history of PE or DVT or estrogen use.  She is on the Depo shot no estrogen.  She denies having a cough fevers chills abdominal pain nausea vomiting or dizziness.    Review of Systems   Constitutional: No fever, chills, or weight loss  Eyes: No visual symptoms.  ENT: No sore throat, runny nose or ear pain.  Respiratory: Shortness of breath.  No cough.  Cardiovascular: Chest pain.  Gastrointestinal: No vomiting, diarrhea or abdominal pain.  Genitourinary: Vaginal discharge.  Musculoskeletal: No joint pain or swelling.  No myalgias  Integumentary: No rashes.  Neurological: Headaches.     Past Medical/Surgical History     Past Medical History:   Diagnosis Date   ??? Asthma      No past surgical history on file.    Social History     Social History     Socioeconomic History   ??? Marital status: SINGLE     Spouse name: Not on file   ??? Number of children: Not on file   ??? Years of education: Not on file   ??? Highest education level: Not on file   Tobacco Use   ??? Smoking status: Current Every Day Smoker     Packs/day: 1.00   ??? Smokeless tobacco: Never Used   Substance and Sexual Activity   ??? Alcohol use: Yes     Comment: every other day   ??? Drug use: Yes     Types: Marijuana   ??? Sexual activity: Yes     Partners: Male     Birth control/protection: Condom     Drinks a pint of [place] every other day.  Family History     Family History   Problem Relation Age of Onset   ??? Diabetes Maternal Grandmother    ??? Diabetes Paternal Grandfather        Home Medications     None       Allergies   No Known Allergies    Physical Exam  ED Triage Vitals [11/30/19 1654]   ED Encounter Vitals Group      BP 119/69      Pulse (Heart Rate) 72      Resp Rate 18      Temp 98.4 ??F (36.9 ??C)      Temp src       O2 Sat (%) 100 %      Weight 110 lb 0.2 oz      Height 5\' 4"      Constitutional: Patient appears well developed and well nourished.  Appearance and behavior are age and situation appropriate. Non toxic in appearance.  She appears slightly anxious.  HEENT: Conjunctivae clear.  PERRL. Mucous membranes moist, non-erythematous. Neck: supple, non tender, symmetrical, no masses or JVD.   Respiratory: lungs clear to auscultation, nonlabored respirations. No tachypnea or accessory muscle use.  Cardiovascular: heart regular rate and rhythm without murmur rubs or gallops. No peripheral edema or significant variscosities.  Distal pulses 2+ bilaterally: dorsalis pedis and radial. Calves soft and nontender on exam no swelling appreciated.    Gastrointestinal:  Abdomen soft, nontender without complaint of pain to palpation. No guarding, distension or rigidity.   GU: exam performed with ER Tech Natalie as Chaperone. External genitalia without swelling, lesions or discharge. Vaginal walls have no bulging or lesions. Cervix pink. Cervical os with small amount of white discharge. White discharge in vaginal vault. No cervical motions tenderness. No adnexal mass or tenderness on exam.   Musculoskeletal: Normal ROM, non-tender.  No obvious deformities, moves all extremities without difficulty  Integumentary: warm and dry without rashes or lesions  Neurologic: alert and oriented, Sensation intact, motor strength equal and symmetric. No focal neurologic deficit.  No focal neurologic deficit.  Impression and Management Plan   This is a 24 year old female with history of asthma presenting to the emergency department with chest pain and shortness of breath usually occurs at nighttime as well as a headache that has been coming and going for the last 3 weeks.  She also has concern for STD exposure.  We will treat her prophylactically for STDs and obtain a wet prep and gonorrhea and chlamydia studies.  Will obtain basic labs EKG and chest x-ray.  Troponin was ordered prior to me evaluating the patient will obtain urine dip and pregnancy.  Patient is PERC negative I have low suspicion for PE.  She has no family history of cardiac disease have low suspicion for cardiac ischemia other differentials are pericarditis and also anxiety.    Diagnostic Studies   Lab:   Recent Results (from the past 12 hour(s))   CBC WITH AUTOMATED DIFF    Collection Time: 11/30/19  8:00 PM   Result Value Ref Range    WBC 5.6 4.0 - 11.0 1000/mm3    RBC 5.43 (H) 3.60 - 5.20 M/uL    HGB 11.5 (L) 13.0 - 17.2 gm/dl    HCT 01/28/20 16.1 - 09.6 %    MCV 73.8 (L) 80.0 - 98.0 fL    MCH 21.2 (L) 25.4 - 34.6 pg    MCHC 28.7 (L) 30.0 - 36.0 gm/dl    PLATELET 04.5 409 - 811 1000/mm3     MPV 10.5 (H) 6.0 - 10.0 fL    RDW-SD 46.7 (H) 36.4 - 46.3      NRBC 0 0 - 0      IMMATURE GRANULOCYTES 0.2 0.0 - 3.0 %    NEUTROPHILS 46.4 34 - 64 %    LYMPHOCYTES 37.0 28 -  48 %    MONOCYTES 8.4 1 - 13 %    EOSINOPHILS 7.5 (H) 0 - 5 %    BASOPHILS 0.5 0 - 3 %   METABOLIC PANEL, BASIC    Collection Time: 11/30/19  8:00 PM   Result Value Ref Range    Sodium 138 136 - 145 mEq/L    Potassium 3.6 3.5 - 5.1 mEq/L    Chloride 106 98 - 107 mEq/L    CO2 25 21 - 32 mEq/L    Glucose 100 74 - 106 mg/dl    BUN 23 7 - 25 mg/dl    Creatinine 1.0 0.6 - 1.3 mg/dl    GFR est AA >83.4      GFR est non-AA >60      Calcium 9.4 8.5 - 10.1 mg/dl    Anion gap 7 5 - 15 mmol/L   TROPONIN I    Collection Time: 11/30/19  8:00 PM   Result Value Ref Range    Troponin-I <0.015 0.000 - 0.045 ng/ml   WET PREP    Collection Time: 11/30/19 10:25 PM    Specimen: Vaginal Specimen; Genital   Result Value Ref Range    Wet prep        No Yeast or motile Trichomonas seen  Clue Cells Seen     POC HCG,URINE    Collection Time: 11/30/19 11:47 PM   Result Value Ref Range    HCG urine, QL negative NEGATIVE,Negative,negative     POC URINE MACROSCOPIC    Collection Time: 11/30/19 11:53 PM   Result Value Ref Range    Glucose Negative NEGATIVE,Negative mg/dl    Bilirubin Negative NEGATIVE,Negative      Ketone Negative NEGATIVE,Negative mg/dl    Specific gravity >=1.962 1.005 - 1.030      Blood Negative NEGATIVE,Negative      pH (UA) 7.0 5 - 9      Protein Negative NEGATIVE,Negative mg/dl    Urobilinogen 0.2 0.0 - 1.0 EU/dl    Nitrites Negative NEGATIVE,Negative      Leukocyte Esterase Negative NEGATIVE,Negative      Color Yellow      Appearance Clear         Imaging:    Xr Chest Pa Lat    Result Date: 11/30/2019   Exam: Frontal and lateral chest. Clinical indication: Dyspnea Comparison: 04/18/2016;  Results:  No consolidation.  No pleural effusions.    No lung  masses. No pneumothorax. Heart normal.  Mediastinal contours are normal. No free air is seen under the hemidiaphragms.   Osseous structures intact.     IMPRESSION:  No acute disease.       EKG Interpretation:  Dr. Cristina Gong interpreted the EKG and did not see any acute S-T segment or T-wave abnormalities that are consistent with acute ischemia or infarction.  Normal sinus rhythm with a rate of 80 bpm, PR interval 174 MS, QRS duration 92 MS, QTC 440 MS.  ED Course/Medical Decision Making    Patient remained stable throughout her stay here in the emergency department.  EKG was obtained and interpreted by Dr. Cristina Gong showing no acute ischemia or infarction.  Chest x-ray was obtained and also interpreted by Dr. Loa Socks showing no acute disease.  CBC showed hemoglobin 11.5 otherwise unremarkable.  Urine was completely normal urine dip was negative.  Chemistry and troponin were both normal.  Wet prep was positive for bacterial vaginosis.  With the patient's exposure to STD she is given azithromycin  and Rocephin here in the emergency department.  We discussed her result of bacterial vaginosis and recommendation for antibiotics.  She is agreeable to that.  Patient is PERC negative she is not on any is autogenous estrogen only the Depo shot and has no other risk factors for PE.  She is not tachycardic she is not currently having any shortness of breath have a low suspicion for PE.  Patient is not having any infectious or viral type symptoms or recent viral infection.  Patient mention that she recently had an abortion and she has been going through a lot of stress.  She has been smoking and drinking alcohol excessively.  We had a thorough and lengthy conversation about the risks of tobacco use and excessive alcohol consumption we thoroughly stressed smoking cessation with patient for total of 10 minutes especially around her children.  We also thoroughly discussed the pint of [place] that she is drinking every other day and thoroughly discouraged the excessive intake.  Patient was asymptomatic throughout her stay she is not having chest pain or shortness of breath no headache these were all symptoms have been coming and going over the last 3 weeks.  I have very low suspicion for emergent pathology today I believe she is stable for discharge home to have close follow-up with primary care provider of which 1 was provided.  She understands return precautions.        Medications    cefTRIAXone (ROCEPHIN) 0.5 g in lidocaine (PF) (XYLOCAINE) 10 mg/mL (1 %) IM injection (0.5 g IntraMUSCular Given 12/01/19 0135)   azithromycin (ZITHROMAX) tablet 1,000 mg (1,000 mg Oral Given 12/01/19 0135)     Final Diagnosis       ICD-10-CM ICD-9-CM   1. Chest pain, unspecified type  R07.9 786.50   2. SOB (shortness of breath)  R06.02 786.05   3. Acute nonintractable headache, unspecified headache type  R51.9 784.0   4. Vaginal discharge  N89.8 623.5   5. Exposure to STD  Z20.2 V01.6   6. BV (bacterial vaginosis)  N76.0 616.10    B96.89 041.9     Disposition   Discharged home in stable condition.  Discharge Medication List as of 12/01/2019 12:33 AM      START taking these medications    Details   metroNIDAZOLE (FlagyL) 500 mg tablet Take 1 Tab by mouth two (2) times a day for 7 days., Print, Disp-14 Tab, R-0             The patient was personally evaluated by myself and Loa Socks, MD who agrees with the above assessment and plan      Vivi Martens PA-C  December 01, 2019    My signature above authenticates this document and my orders, the final    diagnosis (es), discharge prescription (s), and instructions in the Epic    record.  If you have any questions please contact [phone].     Nursing notes have been reviewed by the physician/ advanced practice    Clinician.    Dragon medical dictation software was used for portions of this report. Unintended voice recognition errors may occur.

## 2019-11-30 NOTE — ED Notes (Signed)
Hx of asthma.    Here with c/o pain to chest, tingling to hands. Also c/o headaches and shortness of breath. Symptoms x3 weeks. Symptoms come and go, headaches at night.     Also requesting pelvic exam, states the last time she had gonorrhea she felt the same way. Reports she had a positive test [facility] and they never treated her for it.

## 2019-11-30 NOTE — ED Provider Notes (Signed)
ED Provider Notes by Vivi Martens, PA at 11/30/19 2310                Author: Vivi Martens, PA  Service: Emergency Medicine  Author Type: Physician Assistant       Filed: 12/01/19 0620  Date of Service: 11/30/19 2310  Status: Attested           Editor: Vivi Martens, PA (Physician Assistant)  Cosigner: Loa Socks, MD at 12/01/19 1737          Attestation signed by Loa Socks, MD at 12/01/19 1737          I have discussed this case with the advanced practice provider.  We have reviewed the presentation, pertinent historical and physical exam findings, as well  as relevant laboratory/radiology findings.  I have been available at all times and agree with the management and disposition documented here.                                   [facility]   Emergency Department Treatment Report          Patient: [name] Allen  Age: 24 y.o.  Sex: female          Date of Birth: 1996-09-22  Admit Date: 11/30/2019  PCP: Alfred Levins, MD     MRN: [id number]   CSN: [id number]   Attending: Loa Socks, MD         Room: ER31/ER31  Time Dictated: 11:10 PM  APP: Crystin Lechtenberg        Chief Complaint      Chief Complaint       Patient presents with        ?  Headache     ?  Chest Pain        ?  Vaginal Discharge             History of Present Illness     24 y.o. female  with history of asthma and depression presents to the emergency department concern for STD.  Patient says she has been having some vaginal discharge and is pretty sure she contracted gonorrhea or chlamydia from her previous partner.  She denies any abdominal  pain or cramping.  She denies any nausea or vomiting.      Patient also mentioned that she has been having headaches for the last 3 weeks as well as chest pain and shortness of breath.  Patient said the headache seemed to improve with ibuprofen.  She is not currently having a headache.  Patient said the chest  pain and shortness of breath occurs when  she is going to bed usually at nighttime and then resolves on its own.  She denies any family history of heart disease.  She does smoke 1/2 pack of cigarettes daily as well as marijuana daily.  She denies any hemoptysis,  recent travel or surgery, history of PE or DVT or estrogen use.  She is on the Depo shot no estrogen.  She denies having a cough fevers chills abdominal pain nausea vomiting or dizziness.        Review of Systems     Constitutional: No fever, chills, or weight loss   Eyes: No visual symptoms.   ENT: No sore throat, runny nose or ear pain.   Respiratory: Shortness of breath.  No cough.   Cardiovascular: Chest pain.   Gastrointestinal: No vomiting,  diarrhea or abdominal pain.   Genitourinary: Vaginal discharge.   Musculoskeletal: No joint pain or swelling.  No myalgias   Integumentary: No rashes.   Neurological: Headaches.        Past Medical/Surgical History          Past Medical History:        Diagnosis  Date         ?  Asthma          No past surgical history on file.        Social History          Social History          Socioeconomic History         ?  Marital status:  SINGLE              Spouse name:  Not on file         ?  Number of children:  Not on file     ?  Years of education:  Not on file     ?  Highest education level:  Not on file       Tobacco Use         ?  Smoking status:  Current Every Day Smoker              Packs/day:  1.00         ?  Smokeless tobacco:  Never Used       Substance and Sexual Activity         ?  Alcohol use:  Yes             Comment: every other day         ?  Drug use:  Yes              Types:  Marijuana         ?  Sexual activity:  Yes              Partners:  Male              Birth control/protection:  Condom        Drinks a pint of [place] every other day.     Family History          Family History         Problem  Relation  Age of Onset          ?  Diabetes  Maternal Grandmother            ?  Diabetes  Paternal Grandfather               Home Medications           None             Allergies     No Known Allergies        Physical Exam          ED Triage Vitals [11/30/19 1654]     ED Encounter Vitals Group           BP  119/69        Pulse (Heart Rate)  72        Resp Rate  18        Temp  98.4 ??F (36.9 ??C)        Temp src          O2 Sat (%)  100 %  Weight  110 lb 0.2 oz           Height  5\' 4"         Constitutional: Patient appears well developed and well nourished.  Appearance and behavior are age and situation appropriate. Non toxic in  appearance.  She appears slightly anxious.   HEENT: Conjunctivae clear.  PERRL. Mucous membranes moist, non-erythematous. Neck:  supple, non tender, symmetrical, no masses or JVD.    Respiratory: lungs clear to auscultation, nonlabored respirations. No tachypnea or accessory muscle use.   Cardiovascular: heart regular rate and rhythm without murmur rubs or gallops. No peripheral edema or significant variscosities.  Distal pulses  2+ bilaterally: dorsalis pedis and radial. Calves soft and nontender on exam no swelling appreciated.    Gastrointestinal:  Abdomen soft, nontender without complaint of pain to palpation. No guarding, distension or rigidity.    GU: exam performed with ER Tech Natalie as Chaperone. External genitalia without swelling, lesions or discharge. Vaginal walls have no bulging  or lesions. Cervix pink. Cervical os with small amount of white discharge. White discharge in vaginal vault. No cervical motions tenderness. No adnexal mass or tenderness on exam.    Musculoskeletal: Normal ROM, non-tender.  No obvious deformities, moves all extremities without difficulty   Integumentary: warm and dry without rashes or lesions   Neurologic: alert and oriented, Sensation intact, motor strength equal and symmetric. No focal neurologic deficit.  No focal neurologic deficit.     Impression and Management Plan     This is a 24 year old female with history of asthma presenting to the emergency department with chest pain and  shortness of breath usually occurs at nighttime as well as a headache  that has been coming and going for the last 3 weeks.  She also has concern for STD exposure.  We will treat her prophylactically for STDs and obtain a wet prep and gonorrhea and chlamydia studies.  Will obtain basic labs EKG and chest x-ray.  Troponin  was ordered prior to me evaluating the patient will obtain urine dip and pregnancy.  Patient is PERC negative I have low suspicion for PE.  She has no family history of cardiac disease have low suspicion for cardiac ischemia other differentials are pericarditis  and also anxiety.        Diagnostic Studies     Lab:      Recent Results (from the past 12 hour(s))     CBC WITH AUTOMATED DIFF          Collection Time: 11/30/19  8:00 PM         Result  Value  Ref Range            WBC  5.6  4.0 - 11.0 1000/mm3       RBC  5.43 (H)  3.60 - 5.20 M/uL       HGB  11.5 (L)  13.0 - 17.2 gm/dl       HCT  40.1  37.0 - 50.0 %       MCV  73.8 (L)  80.0 - 98.0 fL       MCH  21.2 (L)  25.4 - 34.6 pg       MCHC  28.7 (L)  30.0 - 36.0 gm/dl       PLATELET  284  140 - 450 1000/mm3       MPV  10.5 (H)  6.0 - 10.0 fL       RDW-SD  46.7 (H)  36.4 - 46.3         NRBC  0  0 - 0         IMMATURE GRANULOCYTES  0.2  0.0 - 3.0 %       NEUTROPHILS  46.4  34 - 64 %       LYMPHOCYTES  37.0  28 - 48 %       MONOCYTES  8.4  1 - 13 %       EOSINOPHILS  7.5 (H)  0 - 5 %       BASOPHILS  0.5  0 - 3 %       METABOLIC PANEL, BASIC          Collection Time: 11/30/19  8:00 PM         Result  Value  Ref Range            Sodium  138  136 - 145 mEq/L       Potassium  3.6  3.5 - 5.1 mEq/L       Chloride  106  98 - 107 mEq/L       CO2  25  21 - 32 mEq/L       Glucose  100  74 - 106 mg/dl       BUN  23  7 - 25 mg/dl       Creatinine  1.0  0.6 - 1.3 mg/dl       GFR est AA  >40.9          GFR est non-AA  >60          Calcium  9.4  8.5 - 10.1 mg/dl       Anion gap  7  5 - 15 mmol/L       TROPONIN I          Collection Time: 11/30/19  8:00 PM          Result  Value  Ref Range            Troponin-I  <0.015  0.000 - 0.045 ng/ml       WET PREP          Collection Time: 11/30/19 10:25 PM       Specimen: Vaginal Specimen; Genital         Result  Value  Ref Range            Wet prep                  No Yeast or motile Trichomonas seen   Clue Cells Seen          POC HCG,URINE          Collection Time: 11/30/19 11:47 PM         Result  Value  Ref Range            HCG urine, QL  negative  NEGATIVE,Negative,negative         POC URINE MACROSCOPIC          Collection Time: 11/30/19 11:53 PM         Result  Value  Ref Range            Glucose  Negative  NEGATIVE,Negative mg/dl       Bilirubin  Negative  NEGATIVE,Negative         Ketone  Negative  NEGATIVE,Negative mg/dl       Specific gravity  >=1.030  1.005 - 1.030  Blood  Negative  NEGATIVE,Negative         pH (UA)  7.0  5 - 9         Protein  Negative  NEGATIVE,Negative mg/dl       Urobilinogen  0.2  0.0 - 1.0 EU/dl       Nitrites  Negative  NEGATIVE,Negative         Leukocyte Esterase  Negative  NEGATIVE,Negative         Color  Yellow               Appearance  Clear              Imaging:     Xr Chest Pa Lat      Result Date: 11/30/2019   Exam: Frontal and lateral chest. Clinical indication: Dyspnea Comparison: 04/18/2016;  Results:  No consolidation.  No pleural effusions.    No lung  masses. No pneumothorax. Heart normal.  Mediastinal contours are normal. No free air is seen under the  hemidiaphragms.   Osseous structures intact.       IMPRESSION:  No acute disease.          EKG Interpretation:  Dr. Cristina GongSantiago Rodriguez interpreted the EKG and did not see any acute S-T segment or T-wave abnormalities that are consistent  with acute ischemia or infarction.  Normal sinus rhythm with a rate of 80 bpm, PR interval 174 MS, QRS duration 92 MS, QTC 440 MS.     ED Course/Medical Decision Making     Patient remained stable throughout her stay here in the emergency department.  EKG was obtained and interpreted by Dr. Cristina GongSantiago  Rodriguez showing no acute ischemia or infarction.   Chest x-ray was obtained and also interpreted by Dr. Loa Socksirza Santiago-Rodriguez showing no acute disease.  CBC showed hemoglobin 11.5 otherwise unremarkable.  Urine was completely normal urine dip was negative.  Chemistry and troponin were both normal.   Wet prep was positive for bacterial vaginosis.  With the patient's exposure to STD she is given azithromycin and Rocephin here in the emergency department.  We discussed her result of bacterial vaginosis and recommendation for antibiotics.  She is agreeable  to that.  Patient is PERC negative she is not on any is autogenous estrogen only the Depo shot and has no other risk factors for PE.  She is not tachycardic she is not currently having any shortness of breath have a low suspicion for PE.  Patient is not  having any infectious or viral type symptoms or recent viral infection.  Patient mention that she recently had an abortion and she has been going through a lot of stress.  She has been smoking and drinking alcohol excessively.  We had a thorough and lengthy  conversation about the risks of tobacco use and excessive alcohol consumption we thoroughly stressed smoking cessation with patient for total of 10 minutes especially around her children.  We also thoroughly discussed the pint of [place] that she is  drinking every other day and thoroughly discouraged the excessive intake.  Patient was asymptomatic throughout her stay she is not having chest pain or shortness of breath no headache these were all symptoms have been coming and going over the last 3  weeks.  I have very low suspicion for emergent pathology today I believe she is stable for discharge home to have close follow-up with primary care provider of which 1 was provided.  She understands return precautions.  Medications       cefTRIAXone (ROCEPHIN) 0.5 g in lidocaine (PF) (XYLOCAINE) 10 mg/mL (1 %) IM injection (0.5 g IntraMUSCular Given  12/01/19 0135)       azithromycin (ZITHROMAX) tablet 1,000 mg (1,000 mg Oral Given 12/01/19 0135)          Final Diagnosis                 ICD-10-CM  ICD-9-CM          1.  Chest pain, unspecified type   R07.9  786.50     2.  SOB (shortness of breath)   R06.02  786.05     3.  Acute nonintractable headache, unspecified headache type   R51.9  784.0     4.  Vaginal discharge   N89.8  623.5     5.  Exposure to STD   Z20.2  V01.6     6.  BV (bacterial vaginosis)   N76.0  616.10           B96.89  041.9          Disposition     Discharged home in stable condition.     Discharge Medication List as of 12/01/2019 12:33 AM              START taking these medications          Details        metroNIDAZOLE (FlagyL) 500 mg tablet  Take 1 Tab by mouth two (2) times a day for 7 days., Print, Disp-14 Tab, R-0                         The patient was personally evaluated by myself and Loa Socks, MD who agrees with the above assessment and plan         Vivi Martens PA-C   December 01, 2019      My signature above authenticates this document and my orders, the final     diagnosis (es), discharge prescription (s), and instructions in the Epic     record.   If you have any questions please contact [phone].       Nursing notes have been reviewed by the physician/ advanced practice     Clinician.      Dragon medical dictation software was used for portions of this report. Unintended voice recognition errors may occur.

## 2019-12-01 LAB — CBC WITH AUTO DIFFERENTIAL
Basophils %: 0.5 % (ref 0–3)
Eosinophils %: 7.5 % — ABNORMAL HIGH (ref 0–5)
Hematocrit: 40.1 % (ref 37.0–50.0)
Hemoglobin: 11.5 gm/dl — ABNORMAL LOW (ref 13.0–17.2)
Immature Granulocytes: 0.2 % (ref 0.0–3.0)
Lymphocytes %: 37 % (ref 28–48)
MCH: 21.2 pg — ABNORMAL LOW (ref 25.4–34.6)
MCHC: 28.7 gm/dl — ABNORMAL LOW (ref 30.0–36.0)
MCV: 73.8 fL — ABNORMAL LOW (ref 80.0–98.0)
MPV: 10.5 fL — ABNORMAL HIGH (ref 6.0–10.0)
Monocytes %: 8.4 % (ref 1–13)
Neutrophils %: 46.4 % (ref 34–64)
Nucleated RBCs: 0 (ref 0–0)
Platelets: 284 10*3/uL (ref 140–450)
RBC: 5.43 M/uL — ABNORMAL HIGH (ref 3.60–5.20)
RDW-SD: 46.7 — ABNORMAL HIGH (ref 36.4–46.3)
WBC: 5.6 10*3/uL (ref 4.0–11.0)

## 2019-12-01 LAB — POC URINE MACROSCOPIC
Bilirubin, Urine: NEGATIVE
Bilirubin: NEGATIVE
Blood, Urine: NEGATIVE
Blood: NEGATIVE
Glucose, Ur: NEGATIVE mg/dl
Glucose: NEGATIVE mg/dl
Ketone: NEGATIVE mg/dl
Ketones, Urine: NEGATIVE mg/dl
Leukocyte Esterase, Urine: NEGATIVE
Leukocyte Esterase: NEGATIVE
Nitrite, Urine: NEGATIVE
Nitrites: NEGATIVE
Protein, UA: NEGATIVE mg/dl
Protein: NEGATIVE mg/dl
Specific Gravity, UA: >=1.03 (ref 1.005–1.030)
Specific gravity: >=1.03 (ref 1.005–1.030)
Urobilinogen, UA, POCT: 0.2 EU/dl (ref 0.0–1.0)
Urobilinogen: 0.2 EU/dl (ref 0.0–1.0)
pH (UA): 7 (ref 5–9)
pH, UA: 7 (ref 5–9)

## 2019-12-01 LAB — WET PREP
Wet Prep: NONE SEEN
Wet prep: NONE SEEN

## 2019-12-01 LAB — BASIC METABOLIC PANEL
Anion Gap: 7 mmol/L (ref 5–15)
BUN: 23 mg/dl (ref 7–25)
CO2: 25 mEq/L (ref 21–32)
Calcium: 9.4 mg/dl (ref 8.5–10.1)
Chloride: 106 mEq/L (ref 98–107)
Creatinine: 1 mg/dl (ref 0.6–1.3)
EGFR IF NonAfrican American: >60
GFR African American: >60
Glucose: 100 mg/dl (ref 74–106)
Potassium: 3.6 mEq/L (ref 3.5–5.1)
Sodium: 138 mEq/L (ref 136–145)

## 2019-12-01 LAB — TROPONIN: Troponin I: <0.015 ng/ml (ref 0.000–0.045)

## 2019-12-01 LAB — POC HCG,URINE
HCG urine, QL: NEGATIVE
Pregnancy Test(Urn): NEGATIVE

## 2019-12-01 LAB — METABOLIC PANEL, BASIC
Anion gap: 7 mmol/L (ref 5–15)
BUN: 23 mg/dl (ref 7–25)
CO2: 25 mEq/L (ref 21–32)
Calcium: 9.4 mg/dl (ref 8.5–10.1)
Chloride: 106 mEq/L (ref 98–107)
Creatinine: 1 mg/dl (ref 0.6–1.3)
GFR est AA: >60
GFR est non-AA: >60
Glucose: 100 mg/dl (ref 74–106)
Potassium: 3.6 mEq/L (ref 3.5–5.1)
Sodium: 138 mEq/L (ref 136–145)

## 2019-12-01 LAB — CBC WITH AUTOMATED DIFF
BASOPHILS: 0.5 % (ref 0–3)
EOSINOPHILS: 7.5 % — ABNORMAL HIGH (ref 0–5)
HCT: 40.1 % (ref 37.0–50.0)
HGB: 11.5 gm/dl — ABNORMAL LOW (ref 13.0–17.2)
IMMATURE GRANULOCYTES: 0.2 % (ref 0.0–3.0)
LYMPHOCYTES: 37 % (ref 28–48)
MCH: 21.2 pg — ABNORMAL LOW (ref 25.4–34.6)
MCHC: 28.7 gm/dl — ABNORMAL LOW (ref 30.0–36.0)
MCV: 73.8 fL — ABNORMAL LOW (ref 80.0–98.0)
MONOCYTES: 8.4 % (ref 1–13)
MPV: 10.5 fL — ABNORMAL HIGH (ref 6.0–10.0)
NEUTROPHILS: 46.4 % (ref 34–64)
NRBC: 0 (ref 0–0)
PLATELET: 284 10*3/uL (ref 140–450)
RBC: 5.43 M/uL — ABNORMAL HIGH (ref 3.60–5.20)
RDW-SD: 46.7 — ABNORMAL HIGH (ref 36.4–46.3)
WBC: 5.6 10*3/uL (ref 4.0–11.0)

## 2019-12-01 LAB — TROPONIN I: Troponin-I: <0.015 ng/ml (ref 0.000–0.045)

## 2019-12-01 MED ORDER — METRONIDAZOLE 500 MG TAB
500 mg | ORAL_TABLET | Freq: Two times a day (BID) | ORAL | 0 refills | Status: AC
Start: 2019-12-01 — End: 2019-12-08

## 2019-12-01 MED ORDER — CEFTRIAXONE 1 GRAM SOLUTION FOR INJECTION
1 gram | INTRAMUSCULAR | Status: AC
Start: 2019-12-01 — End: 2019-12-01
  Administered 2019-12-01: 07:00:00 via INTRAMUSCULAR

## 2019-12-01 MED ORDER — AZITHROMYCIN 250 MG TAB
250 mg | ORAL | Status: AC
Start: 2019-12-01 — End: 2019-12-01
  Administered 2019-12-01: 07:00:00 via ORAL

## 2019-12-01 MED FILL — AZITHROMYCIN 250 MG TAB: 250 mg | ORAL | Qty: 4

## 2019-12-01 MED FILL — CEFTRIAXONE 1 GRAM SOLUTION FOR INJECTION: 1 gram | INTRAMUSCULAR | Qty: 1

## 2019-12-01 NOTE — Other (Signed)
Lab  Patient is positive for chlamydia.  She was called and informed.  She was given standard STD instructions.  She was treated appropriately in the emergency department at the time of her visit.

## 2019-12-01 NOTE — ED Notes (Signed)
PT D/c to home, Education given to PT dc w/ self. No complaints at this time

## 2019-12-01 NOTE — ED Notes (Signed)
PT D/c to home, Education given to PT dc w/ self. No complaints at this time

## 2019-12-03 LAB — CT/GC RRNA PLUS (SP) TMA
C. trachomatis RNA, TMA: DETECTED — AB
C. trachomatis RNA, TMA: DETECTED — AB
N. gonorrhoeae RNA, TMA: NOT DETECTED
N. gonorrhoeae RNA, TMA: NOT DETECTED

## 2020-03-10 ENCOUNTER — Emergency Department: Admit: 2020-03-10 | Payer: PRIVATE HEALTH INSURANCE | Primary: Pediatrics

## 2020-03-10 ENCOUNTER — Inpatient Hospital Stay
Admit: 2020-03-10 | Discharge: 2020-03-10 | Disposition: A | Discharge status: Home / Self Care | Payer: PRIVATE HEALTH INSURANCE | Attending: Emergency Medicine

## 2020-03-10 DIAGNOSIS — R0789 Other chest pain: Secondary | ICD-10-CM

## 2020-03-10 LAB — CBC WITH AUTO DIFFERENTIAL
Basophils %: 0.6 % (ref 0–3)
Eosinophils %: 6.8 % — ABNORMAL HIGH (ref 0–5)
Hematocrit: 38.6 % (ref 37.0–50.0)
Hemoglobin: 11.5 gm/dl — ABNORMAL LOW (ref 13.0–17.2)
Immature Granulocytes: 0 % (ref 0.0–3.0)
Lymphocytes %: 24.3 % — ABNORMAL LOW (ref 28–48)
MCH: 22.6 pg — ABNORMAL LOW (ref 25.4–34.6)
MCHC: 29.8 gm/dl — ABNORMAL LOW (ref 30.0–36.0)
MCV: 75.8 fL — ABNORMAL LOW (ref 80.0–98.0)
MPV: 10.5 fL — ABNORMAL HIGH (ref 6.0–10.0)
Monocytes %: 9.7 % (ref 1–13)
Neutrophils %: 58.6 % (ref 34–64)
Nucleated RBCs: 0 (ref 0–0)
Platelets: 237 10*3/uL (ref 140–450)
RBC: 5.09 M/uL (ref 3.60–5.20)
RDW-SD: 51.6 — ABNORMAL HIGH (ref 36.4–46.3)
WBC: 6.9 10*3/uL (ref 4.0–11.0)

## 2020-03-10 LAB — BASIC METABOLIC PANEL
Anion Gap: 4 mmol/L — ABNORMAL LOW (ref 5–15)
BUN: 16 mg/dl (ref 7–25)
CO2: 26 mEq/L (ref 21–32)
Calcium: 9.2 mg/dl (ref 8.5–10.1)
Chloride: 107 mEq/L (ref 98–107)
Creatinine: 1 mg/dl (ref 0.6–1.3)
EGFR IF NonAfrican American: >60
GFR African American: >60
Glucose: 82 mg/dl (ref 74–106)
Potassium: 5.2 mEq/L — ABNORMAL HIGH (ref 3.5–5.1)
Sodium: 138 mEq/L (ref 136–145)

## 2020-03-10 LAB — EKG 12-LEAD
Atrial Rate: 66 {beats}/min
P Axis: 30 degrees
P-R Interval: 182 ms
Q-T Interval: 408 ms
QRS Duration: 94 ms
QTc Calculation (Bazett): 427 ms
R Axis: 69 degrees
T Axis: 42 degrees
Ventricular Rate: 66 {beats}/min

## 2020-03-10 LAB — D-DIMER, QUANTITATIVE: D-Dimer, Quant: 0.47 ug/mL (FEU) (ref 0.01–0.50)

## 2020-03-10 LAB — POC HCG,URINE
HCG urine, QL: NEGATIVE
Pregnancy Test(Urn): NEGATIVE

## 2020-03-10 LAB — CBC WITH AUTOMATED DIFF
BASOPHILS: 0.6 % (ref 0–3)
EOSINOPHILS: 6.8 % — ABNORMAL HIGH (ref 0–5)
HCT: 38.6 % (ref 37.0–50.0)
HGB: 11.5 gm/dl — ABNORMAL LOW (ref 13.0–17.2)
IMMATURE GRANULOCYTES: 0 % (ref 0.0–3.0)
LYMPHOCYTES: 24.3 % — ABNORMAL LOW (ref 28–48)
MCH: 22.6 pg — ABNORMAL LOW (ref 25.4–34.6)
MCHC: 29.8 gm/dl — ABNORMAL LOW (ref 30.0–36.0)
MCV: 75.8 fL — ABNORMAL LOW (ref 80.0–98.0)
MONOCYTES: 9.7 % (ref 1–13)
MPV: 10.5 fL — ABNORMAL HIGH (ref 6.0–10.0)
NEUTROPHILS: 58.6 % (ref 34–64)
NRBC: 0 (ref 0–0)
PLATELET: 237 10*3/uL (ref 140–450)
RBC: 5.09 M/uL (ref 3.60–5.20)
RDW-SD: 51.6 — ABNORMAL HIGH (ref 36.4–46.3)
WBC: 6.9 10*3/uL (ref 4.0–11.0)

## 2020-03-10 LAB — METABOLIC PANEL, BASIC
Anion gap: 4 mmol/L — ABNORMAL LOW (ref 5–15)
BUN: 16 mg/dl (ref 7–25)
CO2: 26 mEq/L (ref 21–32)
Calcium: 9.2 mg/dl (ref 8.5–10.1)
Chloride: 107 mEq/L (ref 98–107)
Creatinine: 1 mg/dl (ref 0.6–1.3)
GFR est AA: >60
GFR est non-AA: >60
Glucose: 82 mg/dl (ref 74–106)
Potassium: 5.2 mEq/L — ABNORMAL HIGH (ref 3.5–5.1)
Sodium: 138 mEq/L (ref 136–145)

## 2020-03-10 LAB — EKG, 12 LEAD, INITIAL
Atrial Rate: 66 {beats}/min
Calculated P Axis: 30 degrees
Calculated R Axis: 69 degrees
Calculated T Axis: 42 degrees
P-R Interval: 182 ms
Q-T Interval: 408 ms
QRS Duration: 94 ms
QTC Calculation (Bezet): 427 ms
Ventricular Rate: 66 {beats}/min

## 2020-03-10 LAB — D DIMER: D DIMER: 0.47 ug/mL (FEU) (ref 0.01–0.50)

## 2020-03-10 MED ORDER — ALBUTEROL SULF 90 MCG/ACTUATION BREATH ACTIVATED POWDER INHALER,SENSOR
90 mcg/actuation | RESPIRATORY_TRACT | 0 refills | Status: AC
Start: 2020-03-10 — End: 2020-03-15

## 2020-03-10 MED ORDER — BUTALBITAL-ACETAMINOPHEN-CAFFEINE 50 MG-325 MG-40 MG TAB
50-325-40 mg | ORAL | Status: AC
Start: 2020-03-10 — End: 2020-03-10
  Administered 2020-03-10: 15:00:00 via ORAL

## 2020-03-10 MED ORDER — LORATADINE 10 MG TAB
10 mg | ORAL_TABLET | Freq: Every day | ORAL | 0 refills | Status: AC
Start: 2020-03-10 — End: 2020-03-17

## 2020-03-10 MED FILL — BUTALBITAL-ACETAMINOPHEN-CAFFEINE 50 MG-325 MG-40 MG TAB: 50-325-40 mg | ORAL | Qty: 1

## 2020-03-10 NOTE — ED Notes (Signed)
Went over discharge instructions with patient.  Patient given opportunity to ask questions.  Patient verbalized understanding with all instructions.

## 2020-03-10 NOTE — ED Triage Notes (Signed)
C/o headache/migraine . Patient eating and laughing on phone in triage.

## 2020-03-10 NOTE — ED Notes (Signed)
Pt c/o neck pain with limited ROM x1wk, and migraine/headache. Pt has SOB x3 days and sharp pains under her L breast. Pt stated she stopped smoking to see if it would help, but didn't relieve pain.

## 2020-03-10 NOTE — ED Notes (Signed)
Off to Xray

## 2020-03-10 NOTE — ED Provider Notes (Signed)
[facility]  Emergency Department Treatment Report    [name] Allen: [name] Allen Age: 24 y.o. Sex: female    Date of Birth: 1995/12/10 Admit Date: 03/10/2020 PCP: Eliott Nine, MD   MRN: [id number]  CSN: [id number]  Attending: Maylon Cos, MD   Room: [phone] Time Dictated: 12:14 PM APP: Rexanne Mano, PA-C     Chief Complaint   Chief Complaint   [name] Allen presents with   ??? Headache       History of Present Illness   24 y.o. female with PMHx of asthma presenting with chest tightness, shortness of breath, headache that started 2 days ago.  [name] Allen states there is no precipitating factors to her symptoms, her headache was not sudden in onset.  She states it feels like a migraine, she is taken over-the-counter medication which provide her with no relief.  Headache is rated as 7/10, achy in nature.  She also is endorsing shortness of breath.  She states her asthma has been getting worse recently with the change in weather.  She does not have albuterol here at home, she is requesting a refill here today.  She also states that her chest feels tight, so she is not getting get air in and out.  She does take Depo-Provera shot for birth control.  Nothing seems to make her symptoms better or worse.  She has been tolerating PO.  She also feels as though she has been having loss of taste and smell.  She states she has not had any exposure to Covid, and she does not want to be tested for Covid today.  She states he would is quarantine as though she does have Covid.  She does endorse alcohol weekly, in half pack of cigarettes a day.  She states she has been cutting back on her cigarette smoke due to pain in the left side of her chest.  No recent medication changes. Denies fever, chills, nausea, vomiting, weakness, numbness, visual disturbance, recent surgery.      Review of Systems   Constitutional: No fever, chills, malaise  EYES: No visual symptoms  ENT: No neck stiffness, sore throat, runny nose or  other URI symptoms  RESP: No  wheezing  CV: No chest pain, pressure, palpitations  GI: No nausea, vomiting, diarrhea, abdominal pain  GU: No dysuria, frequency, urgency  MSK: No leg swelling  SKIN: No rashes  NEURO: See above. No recent head injury, confusion, ataxia, changes in speech, lightheadedness/dizziness, headaches, LOC, weakness, paresthesias     Past Medical/Surgical History     Past Medical History:   Diagnosis Date   ??? Asthma      History reviewed. No pertinent surgical history.    Social History     Social History     Socioeconomic History   ??? Marital status: SINGLE     Spouse name: Not on file   ??? Number of children: Not on file   ??? Years of education: Not on file   ??? Highest education level: Not on file   Tobacco Use   ??? Smoking status: Current Every Day Smoker     Packs/day: 1.00   ??? Smokeless tobacco: Never Used   Substance and Sexual Activity   ??? Alcohol use: Yes     Comment: every other day   ??? Drug use: Yes     Types: Marijuana   ??? Sexual activity: Yes     Partners: Male     Birth control/protection: Condom, Injection  Family History     Family History   Problem Relation Age of Onset   ??? Diabetes Maternal Grandmother    ??? Diabetes Paternal Grandfather        Current Medications     Prior to Admission Medications   Prescriptions Last Dose Informant [name] Allen Reported? Taking?   loratadine (CLARITIN PO)   Yes Yes   Sig: Take  by mouth.      Facility-Administered Medications: None     Allergies   No Known Allergies  Physical Exam     ED Triage Vitals [03/10/20 0949]   ED Encounter Vitals Group      BP 123/88      Pulse (Heart Rate) 76      Resp Rate 18      Temp 98.7 ??F (37.1 ??C)      Temp src       O2 Sat (%) 99 %      Weight 105 lb      Height 5\' 5"      Constitutional: Well developed, well nourished.  Sitting exam chair, no acute distress.  [name] Allen does sound congested when talking.  HEAD: Normocephalic, atraumatic  EYES: Conjuctivae clear, lids normal.  Pupils equal, symmetrical, and normally  reactive.  ENT: EARS: Canals and TM's are clear bilaterally. MOUTH/THROAT: Oral mucosa is pink, moist and without lesions. Uvula midline. Posterior pharynx normal. Airway patent. No stridor.   NECK: Soft, supple, nontender. No meningismus.   RESP: Clear and equal BS. No wheezing, rhonchi, crackles, or rales. No respiratory distress, tachypnea, or accessory muscle use.  CV: Heart: regular, without murmurs, gallops, rubs, or thrills.  CHEST: Symmetric without masses or rash. Nontender to palpation.  VASCULAR: Calfs soft and nontender. No peripheral edema. DP pulses 2+ and equal bilaterally.  GI:  Abdomen soft, non-distended, nontender to palpation. No hepatomegaly or splenomegaly. No abdominal masses appreciated by inspection or palpation.   MSK: Moves all 4 extremities spontaneously.   SKIN: Warm and dry without rashes.  NEURO: Alert, oriented x 3. There is no facial asymmetry or dysarthria. EOM intact, no nystagmus. Visual fields intact by confrontation. Light sensation intact and symmetric throughout dermatomes of upper and lower extremities bilaterally. Motor strength 5/5 to all 4 extremities. DTR intact. Good finger-to-nose and heel-to-shin bilaterally. Radial and DP pulses 2+ brisk and symmetric bilaterally. Nailbeds are pink with prompt capillary refill. Stance and gait normal, no ataxia.     Impression and Management Plan   24 y.o. female presenting with headache, chest tightness, shortness of breath. On exam [name] Allen well-appearing, no acute distress.  Lungs are clear and equal bilaterally.  Heart with a regular rate and rhythm, no murmur, no rub, no gallop.  She does have risk factors of birth control, will obtain D-dimer.  We will also obtain troponin, chest x-ray, basic labs.  After her breathing treatment, she is declined this time. Neurologic exam is nonfocal, not consistent with CVA or primary neurologic abnormality.    DDx includes but is not limited to: viral illness, bronchitis, asthma exacerbation,  pneumonia, sinusitis, pharyngitis, dehydration    Diagnostic Studies   Lab:   Results for orders placed or performed during the hospital encounter of 03/10/20   XR CHEST PA LAT    Narrative    EXAM: Frontal and lateral views of the chest.    INDICATIONS: sob, intermittent chest pain;    COMPARISON: Most recently 11/30/2019.    FINDINGS:    The lungs are clear. Normal cardiomediastinal contours.  Thoracic spine is normal for age.      Impression    IMPRESSION:   Normal chest.     D DIMER   Result Value Ref Range    D DIMER 0.47 0.01 - 0.50 ug/mL (FEU)   CBC WITH AUTOMATED DIFF   Result Value Ref Range    WBC 6.9 4.0 - 11.0 1000/mm3    RBC 5.09 3.60 - 5.20 M/uL    HGB 11.5 (L) 13.0 - 17.2 gm/dl    HCT 41.9 62.2 - 29.7 %    MCV 75.8 (L) 80.0 - 98.0 fL    MCH 22.6 (L) 25.4 - 34.6 pg    MCHC 29.8 (L) 30.0 - 36.0 gm/dl    PLATELET 989 211 - 941 1000/mm3    MPV 10.5 (H) 6.0 - 10.0 fL    RDW-SD 51.6 (H) 36.4 - 46.3      NRBC 0 0 - 0      IMMATURE GRANULOCYTES 0.0 0.0 - 3.0 %    NEUTROPHILS 58.6 34 - 64 %    LYMPHOCYTES 24.3 (L) 28 - 48 %    MONOCYTES 9.7 1 - 13 %    EOSINOPHILS 6.8 (H) 0 - 5 %    BASOPHILS 0.6 0 - 3 %   METABOLIC PANEL, BASIC   Result Value Ref Range    Sodium 138 136 - 145 mEq/L    Potassium 5.2 (H) 3.5 - 5.1 mEq/L    Chloride 107 98 - 107 mEq/L    CO2 26 21 - 32 mEq/L    Glucose 82 74 - 106 mg/dl    BUN 16 7 - 25 mg/dl    Creatinine 1.0 0.6 - 1.3 mg/dl    GFR est AA >74.0      GFR est non-AA >60      Calcium 9.2 8.5 - 10.1 mg/dl    Anion gap 4 (L) 5 - 15 mmol/L   POC HCG,URINE   Result Value Ref Range    HCG urine, QL negative NEGATIVE,Negative,negative       EKG Interpretation  EKG read by Dr. Thana Ates. No acute S-T segment or T-wave abnormalities that are consistent with acute ischemia or infarction.    Vent rate: 66 bpm  PR Interval: 182 ms  QRS duration: 94 ms  QT/QTc: 408/427 ms    ED Course/Medical Decision Making   [name] Allen remained clinically stable throughout the emergency room visit.  Vitals  were unremarkable and the [name] Allen's condition required no further Emergency Department intervention.  The initial emergency department evaluation of this [name] Allen appears to be negative for evidence of acute coronary ischemia.  The [name] Allen's Heart Score is less than 3 making the risk of major adverse cardiac event less than 1% over the next 6 weeks, and additional risk stratification does not meaningfully change that likelihood.  The [name] Allen prefers to go home and does not desire to stay unless it is critical that they do. The remainder of the workup showed no evidence of ACS, PE, TAD, esophageal rupture, pneumothorax, acute bacterial infection, or other emergent causing requiring emergent intervention or admission.  [name] Allen has declined Covid testing at this time, however I have discussed with her quarantine recommendations.  I have sent prescription for albuterol here to her preferred pharmacy on file.  Have also instructed her to use Claritin, and Flonase for her congestive symptoms.     ED Course as of Mar 10 1822   Fri Mar 10, 2020   1213 I  have reviewed labs and findings. Labs remarkable for with elevated potassium of 5.2. labs otherwise unremarkable. D dimer is not elevated. She does have slightly decreased hgb of 11.5.     [DB]   1216 [name] Allen no longer has a headache. Will discharge with follow up with PCP. Will send albuterol inhaler to preferred pharmacy.     [DB]      ED Course User Index  [DB] Valor Quaintance N, PA       This was explained to the [name] Allen and they are desiring to go home and continue outpatient workup.  The [name] Allen is stable for outpatient management with appropriate symptomatic treatment, return precautions were given appropriate to their visit, and they were advised of the importance of outpatient follow up for ongoing care.    Final Diagnosis   No diagnosis found.    Disposition   The [name] Allen was discharged home in stable condition will follow-up as outlined above.    Sarahbeth Cashin, PA-C  Mar 10, 2020    The [name] Allen was evaluated by myself and fully discussed with attending [facility], Judeen Hammans, MD who agrees with the above assessment and plan.    Nursing notes have been reviewed by the physician/ advanced practice Clinician.    Dragon medical dictation was used for portions of this report. Unintended grammatical errors may occur.    My signature above authenticates this document and my orders, the final diagnosis (es), discharge prescription (s), and instructions in the Epic record. If you have any questions please contact [phone].

## 2020-03-10 NOTE — ED Notes (Signed)
Pt c/o neck pain with limited ROM x1wk, and migraine/headache. Pt has SOB x3 days and sharp pains under her L breast. Pt stated she stopped smoking to see if it would help, but didn't relieve pain.

## 2020-03-10 NOTE — ED Provider Notes (Signed)
ED Provider Notes by Towana Badger, PA at 03/10/20 1214                Author: Towana Badger, PA  Service: Emergency Medicine  Author Type: Physician Assistant       Filed: 03/10/20 1824  Date of Service: 03/10/20 1214  Status: Attested           Editor: Arriah Wadle, Laverta Ali Chuk, PA (Physician Assistant)  Cosigner: Elveria Royals, MD at 03/10/20 1918          Attestation signed by Elveria Royals, MD at 03/10/20 1918          I discussed the patient with the mid-level provider and agree with the evaluation and treatment plan as documented here. I did not personally see the patient  but was available to see the patient at their request.                                      [facility]   Emergency Department Treatment Report          Patient: [name] Allen  Age: 24 y.o.  Sex: female          Date of Birth: 1996/01/14  Admit Date: 03/10/2020  PCP: Alfred Levins, MD     MRN: [id number]   CSN: [id number]   Attending: Elveria Royals, MD         Room: [phone]  Time Dictated: 12:14 PM  APP: Marcelyn Bruins, PA-C        Chief Complaint      Chief Complaint       Patient presents with        ?  Headache             History of Present Illness     24 y.o. female  with PMHx of asthma presenting with chest tightness, shortness of breath, headache that started 2 days ago.  Patient states there is no precipitating factors to her symptoms, her headache was not sudden in onset.  She states it feels like a migraine,  she is taken over-the-counter medication which provide her with no relief.  Headache is rated as 7/10, achy in nature.  She also is endorsing shortness of breath.  She states her asthma has been getting worse recently with the change in weather.  She  does not have albuterol here at home, she is requesting a refill here today.  She also states that her chest feels tight, so she is not getting get air in and out.  She does take Depo-Provera shot for birth control.  Nothing  seems to make her symptoms  better or worse.  She has been tolerating PO.  She also feels as though she has been having loss of taste and smell.  She states she has not had any exposure to Covid, and she does not want to be tested for Covid today.  She states he would is quarantine  as though she does have Covid.  She does endorse alcohol weekly, in half pack of cigarettes a day.  She states she has been cutting back on her cigarette smoke due to pain in the left side of her chest.  No recent medication changes. Denies fever, chills,  nausea, vomiting, weakness, numbness, visual disturbance, recent surgery.          Review of Systems  Constitutional: No fever, chills, malaise   EYES: No visual symptoms   ENT: No neck stiffness, sore throat, runny nose or other URI symptoms   RESP: No  wheezing   CV: No chest pain, pressure, palpitations   GI: No nausea, vomiting, diarrhea, abdominal pain   GU: No dysuria, frequency, urgency   MSK: No leg swelling   SKIN: No rashes   NEURO: See above. No recent head injury, confusion, ataxia, changes in speech, lightheadedness/dizziness, headaches, LOC, weakness, paresthesias         Past Medical/Surgical History          Past Medical History:        Diagnosis  Date         ?  Asthma          History reviewed. No pertinent surgical history.        Social History          Social History          Socioeconomic History         ?  Marital status:  SINGLE              Spouse name:  Not on file         ?  Number of children:  Not on file     ?  Years of education:  Not on file     ?  Highest education level:  Not on file       Tobacco Use         ?  Smoking status:  Current Every Day Smoker              Packs/day:  1.00         ?  Smokeless tobacco:  Never Used       Substance and Sexual Activity         ?  Alcohol use:  Yes             Comment: every other day         ?  Drug use:  Yes              Types:  Marijuana         ?  Sexual activity:  Yes              Partners:  Male               Birth control/protection:  Condom, Injection             Family History          Family History         Problem  Relation  Age of Onset          ?  Diabetes  Maternal Grandmother            ?  Diabetes  Paternal Grandfather               Current Medications          Prior to Admission Medications     Prescriptions  Last Dose  Informant  Patient Reported?  Taking?      loratadine (CLARITIN PO)      Yes  Yes      Sig: Take  by mouth.               Facility-Administered Medications: None          Allergies     No  Known Allergies     Physical Exam          ED Triage Vitals [03/10/20 0949]     ED Encounter Vitals Group           BP  123/88        Pulse (Heart Rate)  76        Resp Rate  18        Temp  98.7 ??F (37.1 ??C)        Temp src          O2 Sat (%)  99 %        Weight  105 lb           Height  5\' 5"         Constitutional: Well developed, well nourished.  Sitting exam chair, no acute distress.  Patient does sound congested when talking.   HEAD: Normocephalic, atraumatic   EYES: Conjuctivae clear, lids normal.  Pupils equal, symmetrical, and normally reactive.   ENT: EARS: Canals and TM's are clear bilaterally. MOUTH/THROAT: Oral mucosa is pink, moist and without lesions. Uvula midline. Posterior pharynx  normal. Airway patent. No stridor.    NECK: Soft, supple, nontender. No meningismus.    RESP: Clear and equal BS. No wheezing, rhonchi, crackles, or rales. No respiratory distress, tachypnea, or accessory muscle use.   CV: Heart: regular, without murmurs, gallops, rubs, or thrills.   CHEST: Symmetric without masses or rash. Nontender to palpation.   VASCULAR: Calfs soft and nontender. No peripheral edema. DP pulses 2+ and equal bilaterally.   GI:  Abdomen soft, non-distended, nontender to palpation. No hepatomegaly or splenomegaly. No abdominal masses appreciated by inspection or  palpation.    MSK: Moves all 4 extremities spontaneously.    SKIN: Warm and dry without rashes.   NEURO: Alert, oriented x 3. There  is no facial asymmetry or dysarthria. EOM intact, no nystagmus. Visual fields intact by confrontation. Light  sensation intact and symmetric throughout dermatomes of upper and lower extremities bilaterally. Motor strength 5/5 to all 4 extremities. DTR intact. Good finger-to-nose and heel-to-shin bilaterally. Radial and DP pulses 2+ brisk and symmetric bilaterally.  Nailbeds are pink with prompt capillary refill. Stance and gait normal, no ataxia.         Impression and Management Plan     24 y.o. female  presenting with headache, chest tightness, shortness of breath. On exam patient well-appearing, no acute distress.  Lungs are clear and equal bilaterally.  Heart with a regular rate and rhythm, no murmur, no rub, no gallop.  She does have risk factors  of birth control, will obtain D-dimer.  We will also obtain troponin, chest x-ray, basic labs.  After her breathing treatment, she is declined this time. Neurologic exam is nonfocal, not consistent with CVA or primary neurologic abnormality.      DDx includes but is not limited to: viral illness, bronchitis, asthma exacerbation, pneumonia, sinusitis, pharyngitis, dehydration        Diagnostic Studies     Lab:      Results for orders placed or performed during the hospital encounter of 03/10/20     XR CHEST PA LAT          Narrative          EXAM: Frontal and lateral views of the chest.      INDICATIONS: sob, intermittent chest pain;      COMPARISON: Most recently 11/30/2019.      FINDINGS:  The lungs are clear. Normal cardiomediastinal contours.      Thoracic spine is normal for age.             Impression       IMPRESSION:    Normal chest.          D DIMER         Result  Value  Ref Range            D DIMER  0.47  0.01 - 0.50 ug/mL (FEU)       CBC WITH AUTOMATED DIFF         Result  Value  Ref Range            WBC  6.9  4.0 - 11.0 1000/mm3       RBC  5.09  3.60 - 5.20 M/uL       HGB  11.5 (L)  13.0 - 17.2 gm/dl       HCT  38.6  37.0 - 50.0 %       MCV  75.8 (L)   80.0 - 98.0 fL       MCH  22.6 (L)  25.4 - 34.6 pg       MCHC  29.8 (L)  30.0 - 36.0 gm/dl       PLATELET  237  140 - 450 1000/mm3       MPV  10.5 (H)  6.0 - 10.0 fL       RDW-SD  51.6 (H)  36.4 - 46.3         NRBC  0  0 - 0         IMMATURE GRANULOCYTES  0.0  0.0 - 3.0 %       NEUTROPHILS  58.6  34 - 64 %       LYMPHOCYTES  24.3 (L)  28 - 48 %       MONOCYTES  9.7  1 - 13 %       EOSINOPHILS  6.8 (H)  0 - 5 %       BASOPHILS  0.6  0 - 3 %       METABOLIC PANEL, BASIC         Result  Value  Ref Range            Sodium  138  136 - 145 mEq/L       Potassium  5.2 (H)  3.5 - 5.1 mEq/L       Chloride  107  98 - 107 mEq/L       CO2  26  21 - 32 mEq/L       Glucose  82  74 - 106 mg/dl       BUN  16  7 - 25 mg/dl       Creatinine  1.0  0.6 - 1.3 mg/dl       GFR est AA  >60.0          GFR est non-AA  >60          Calcium  9.2  8.5 - 10.1 mg/dl       Anion gap  4 (L)  5 - 15 mmol/L       POC HCG,URINE         Result  Value  Ref Range            HCG urine, QL  negative  NEGATIVE,Negative,negative  EKG Interpretation   EKG read by Dr. Thana Ates. No acute S-T segment or T-wave abnormalities that are consistent with acute ischemia or infarction.     Vent rate: 66 bpm   PR Interval: 182 ms   QRS duration: 94 ms   QT/QTc: 408/427 ms        ED Course/Medical Decision Making     Patient remained clinically stable throughout the emergency room visit.  Vitals were unremarkable and the patient's condition required no further Emergency Department intervention.   The initial emergency department evaluation of this patient appears to be negative for evidence of acute coronary ischemia.  The patient's Heart Score is less than 3 making the risk of major adverse cardiac event less than 1% over the next 6 weeks, and  additional risk stratification does not meaningfully change that likelihood.  The patient prefers to go home and does not desire to stay unless it is critical that they do. The remainder of the workup showed no evidence of  ACS, PE, TAD, esophageal rupture,  pneumothorax, acute bacterial infection, or other emergent causing requiring emergent intervention or admission.  Patient has declined Covid testing at this time, however I have discussed with her quarantine recommendations.  I have sent prescription  for albuterol here to her preferred pharmacy on file.  Have also instructed her to use Claritin, and Flonase for her congestive symptoms.         ED Course as of Mar 10 1822       Fri Mar 10, 2020        1213  I have reviewed labs and findings. Labs remarkable for with elevated potassium of 5.2. labs otherwise unremarkable. D dimer is not elevated. She does  have slightly decreased hgb of 11.5.      [DB]     1216  Patient no longer has a headache. Will discharge with follow up with PCP. Will send albuterol inhaler to preferred pharmacy.      [DB]              ED Course User Index   [DB] Yadriel Kerrigan N, PA           This was explained to the patient and they are desiring to go home and continue outpatient workup.  The patient is stable for outpatient management with appropriate symptomatic treatment, return precautions were given appropriate to their visit, and they  were advised of the importance of outpatient follow up for ongoing care.        Final Diagnosis     No diagnosis found.        Disposition     The patient was discharged home in stable condition will follow-up as outlined above.      Miosotis Wetsel, PA-C   Mar 10, 2020      The patient was evaluated by myself and fully discussed with attending [facility], Judeen Hammans, MD who agrees with the above assessment and plan.      Nursing notes have been reviewed by the physician/ advanced practice Clinician.      Dragon medical dictation was used for portions of this report. Unintended grammatical errors may occur.      My signature above authenticates this document and my orders, the final diagnosis (es), discharge prescription (s), and instructions in the Epic record. If you  have any questions please contact [phone].

## 2020-03-10 NOTE — ED Notes (Signed)
C/o headache/migraine . Patient eating and laughing on phone in triage.

## 2020-09-04 ENCOUNTER — Inpatient Hospital Stay
Admit: 2020-09-04 | Discharge: 2020-09-04 | Disposition: A | Discharge status: Home / Self Care | Payer: PRIVATE HEALTH INSURANCE | Attending: Emergency Medicine

## 2020-09-04 ENCOUNTER — Emergency Department: Admit: 2020-09-04 | Payer: PRIVATE HEALTH INSURANCE | Primary: Pediatrics

## 2020-09-04 DIAGNOSIS — R0789 Other chest pain: Secondary | ICD-10-CM

## 2020-09-04 LAB — BASIC METABOLIC PANEL
Anion Gap: 4 mmol/L — ABNORMAL LOW (ref 5–15)
BUN: 17 mg/dl (ref 7–25)
CO2: 27 mEq/L (ref 21–32)
Calcium: 9.3 mg/dl (ref 8.5–10.1)
Chloride: 111 mEq/L — ABNORMAL HIGH (ref 98–107)
Creatinine: 1 mg/dl (ref 0.6–1.3)
EGFR IF NonAfrican American: >60
GFR African American: >60
Glucose: 77 mg/dl (ref 74–106)
Potassium: 3.8 mEq/L (ref 3.5–5.1)
Sodium: 142 mEq/L (ref 136–145)

## 2020-09-04 LAB — D-DIMER, QUANTITATIVE: D-Dimer, Quant: <0.22 ug/mL (FEU) (ref 0.01–0.50)

## 2020-09-04 LAB — EKG 12-LEAD
Atrial Rate: 85 {beats}/min
Diagnosis: NORMAL
P Axis: 58 degrees
P-R Interval: 166 ms
Q-T Interval: 370 ms
QRS Duration: 104 ms
QTc Calculation (Bazett): 440 ms
R Axis: 81 degrees
T Axis: 65 degrees
Ventricular Rate: 85 {beats}/min

## 2020-09-04 LAB — CBC WITH AUTO DIFFERENTIAL
Basophils %: 0.5 % (ref 0–3)
Eosinophils %: 5.7 % — ABNORMAL HIGH (ref 0–5)
Hematocrit: 40.3 % (ref 37.0–50.0)
Hemoglobin: 12.2 gm/dl — ABNORMAL LOW (ref 13.0–17.2)
Immature Granulocytes: 0.2 % (ref 0.0–3.0)
Lymphocytes %: 32.7 % (ref 28–48)
MCH: 24.3 pg — ABNORMAL LOW (ref 25.4–34.6)
MCHC: 30.3 gm/dl (ref 30.0–36.0)
MCV: 80.1 fL (ref 80.0–98.0)
MPV: 10.5 fL — ABNORMAL HIGH (ref 6.0–10.0)
Monocytes %: 9.7 % (ref 1–13)
Neutrophils %: 51.2 % (ref 34–64)
Nucleated RBCs: 0 (ref 0–0)
Platelets: 248 10*3/uL (ref 140–450)
RBC: 5.03 M/uL (ref 3.60–5.20)
RDW-SD: 52.2 — ABNORMAL HIGH (ref 36.4–46.3)
WBC: 6 10*3/uL (ref 4.0–11.0)

## 2020-09-04 LAB — TROPONIN, HIGH SENSITIVITY: Troponin, High Sensitivity: 5 ng/L (ref 0–59)

## 2020-09-04 LAB — METABOLIC PANEL, BASIC
Anion gap: 4 mmol/L — ABNORMAL LOW (ref 5–15)
BUN: 17 mg/dl (ref 7–25)
CO2: 27 mEq/L (ref 21–32)
Calcium: 9.3 mg/dl (ref 8.5–10.1)
Chloride: 111 mEq/L — ABNORMAL HIGH (ref 98–107)
Creatinine: 1 mg/dl (ref 0.6–1.3)
GFR est AA: >60
GFR est non-AA: >60
Glucose: 77 mg/dl (ref 74–106)
Potassium: 3.8 mEq/L (ref 3.5–5.1)
Sodium: 142 mEq/L (ref 136–145)

## 2020-09-04 LAB — EKG, 12 LEAD, INITIAL
Atrial Rate: 85 {beats}/min
Calculated P Axis: 58 degrees
Calculated R Axis: 81 degrees
Calculated T Axis: 65 degrees
Diagnosis: NORMAL
P-R Interval: 166 ms
Q-T Interval: 370 ms
QRS Duration: 104 ms
QTC Calculation (Bezet): 440 ms
Ventricular Rate: 85 {beats}/min

## 2020-09-04 LAB — TROPONIN-HIGH SENSITIVITY: Troponin-High Sensitivity: 5 ng/L (ref 0–59)

## 2020-09-04 LAB — CBC WITH AUTOMATED DIFF
BASOPHILS: 0.5 % (ref 0–3)
EOSINOPHILS: 5.7 % — ABNORMAL HIGH (ref 0–5)
HCT: 40.3 % (ref 37.0–50.0)
HGB: 12.2 gm/dl — ABNORMAL LOW (ref 13.0–17.2)
IMMATURE GRANULOCYTES: 0.2 % (ref 0.0–3.0)
LYMPHOCYTES: 32.7 % (ref 28–48)
MCH: 24.3 pg — ABNORMAL LOW (ref 25.4–34.6)
MCHC: 30.3 gm/dl (ref 30.0–36.0)
MCV: 80.1 fL (ref 80.0–98.0)
MONOCYTES: 9.7 % (ref 1–13)
MPV: 10.5 fL — ABNORMAL HIGH (ref 6.0–10.0)
NEUTROPHILS: 51.2 % (ref 34–64)
NRBC: 0 (ref 0–0)
PLATELET: 248 10*3/uL (ref 140–450)
RBC: 5.03 M/uL (ref 3.60–5.20)
RDW-SD: 52.2 — ABNORMAL HIGH (ref 36.4–46.3)
WBC: 6 10*3/uL (ref 4.0–11.0)

## 2020-09-04 LAB — D DIMER: D DIMER: <0.22 ug/mL (FEU) (ref 0.01–0.50)

## 2020-09-04 MED ORDER — NAPROXEN 500 MG TAB
500 mg | ORAL_TABLET | Freq: Two times a day (BID) | ORAL | 0 refills | Status: AC
Start: 2020-09-04 — End: 2020-09-14

## 2020-09-04 MED ORDER — METHOCARBAMOL 750 MG TAB
750 mg | ORAL_TABLET | Freq: Four times a day (QID) | ORAL | 0 refills | Status: AC | PRN
Start: 2020-09-04 — End: 2021-09-07

## 2020-09-04 MED ORDER — ASPIRIN 81 MG CHEWABLE TAB
81 mg | ORAL | Status: DC
Start: 2020-09-04 — End: 2020-09-04

## 2020-09-04 MED FILL — ASPIRIN 81 MG CHEWABLE TAB: 81 mg | ORAL | Qty: 4

## 2020-09-04 NOTE — ED Provider Notes (Signed)
ED  Provider Notes by Merian CapronVitto, Shekita Boyden, PA at 09/04/20 1130                Author: Merian CapronVitto, Tehani Mersman, PA  Service: Emergency Medicine  Author Type: Physician Assistant       Filed: 09/04/20 1346  Date of Service: 09/04/20 1130  Status: Attested           Editor: Merian CapronVitto, Enoch Moffa, PA (Physician Assistant)  Cosigner: Christiana PellantFickenscher, Ben A, MD at 09/06/20 1402          Attestation signed by Christiana PellantFickenscher, Ben A, MD at 09/06/20 1402          I have discussed the patient's presentation with the APP.  Based upon their report of the patient's complaint(s), pertinent history, pertinent exam, and testing  results, I agree with their assessment and management plan.  I have been available for consultation throughout the patient's stay in the Emergency Department.                                    [facility]   Emergency Department Treatment Report          Patient: [name] QuitterShardaye R Allen  Age: 24 y.o.  Sex: female          Date of Birth: 10/07/1996  Admit Date: 09/04/2020  PCP: Alfred Levinsurry, Michelle T, MD     MRN: [id number]   CSN: [id number]   ATTENDING: Christiana PellantFICKENSCHER, BEN A, MD         Room: 102/EO02  Time Dictated: 11:30 AM  APP: Merian Capronawn Lalani Winkles, PA-C        Chief Complaint      Chief Complaint       Patient presents with        ?  Chest Pain             History of Present Illness     24 y.o. female  presenting to the emergency department for evaluation of chest pain and shoulder pain.  Patient states that recently she has been having intermittent pain up under the left breast.  Today when she woke up she noticed left-sided chest pain when she takes  a deep breath radiating to the left shoulder and into the left upper back.  She states that this is severe and sharp and stabbing in nature.  States no pain unless she takes a deep breath.  Denies shortness of breath.  Patient does not use any exogenous  hormones, denies any leg pain or swelling, no history of DVT or PE.        Review of Systems     Constitutional:  No fever, chills, body  aches   ENT: No sore throat or runny nose   Respiratory: No cough, shortness of breath, or wheezing   Cardiovascular: +chest pain, shoulder pain.  No palpitations   Gastrointestinal: No abdominal pain, nausea, vomiting, diarrhea, or constipation   Genitourinary: No dysuria or frequency.   Musculoskeletal: No swelling or joint pain   Integumentary: No rashes or lesions   Hematologic: No easy bruising/bleeding.   Neurological: No headache or dizziness.        Past Medical/Surgical History          Past Medical History:        Diagnosis  Date         ?  Asthma  No past surgical history on file.        Social History          Social History          Socioeconomic History         ?  Marital status:  SINGLE       Tobacco Use         ?  Smoking status:  Current Every Day Smoker              Packs/day:  1.00         ?  Smokeless tobacco:  Never Used       Vaping Use         ?  Vaping Use:  Never used       Substance and Sexual Activity         ?  Alcohol use:  Yes             Comment: every other day         ?  Drug use:  Yes              Types:  Marijuana         ?  Sexual activity:  Yes              Partners:  Male              Birth control/protection:  Condom, Injection             Family History          Family History         Problem  Relation  Age of Onset          ?  Diabetes  Maternal Grandmother            ?  Diabetes  Paternal Grandfather               Current Medications          None             Allergies     No Known Allergies        Physical Exam          ED Triage Vitals [09/04/20 1019]     ED Encounter Vitals Group           BP  120/73        Pulse (Heart Rate)  75        Resp Rate  15        Temp  98.7 ??F (37.1 ??C)        Temp src          O2 Sat (%)  100 %        Weight             Height          Constitutional: Pleasant nontoxic-appearing female seated upright on the stretcher speaking in full clear sentences in no distress   HEENT: Conjunctiva clear.  Mucous membranes moist, non-erythematous.  Surface of the pharynx, palate, and tongue are pink, moist and without  lesions.   Neck: supple, non tender, symmetrical, no masses or lymphadenopathy    Respiratory: lungs clear to auscultation, nonlabored respirations. No tachypnea or accessory muscle use.   Cardiovascular: heart regular rate and rhythm without murmur rubs or gallops.      Gastrointestinal:  Soft, non-tender, non-distended, normoactive bowel sounds   Musculoskeletal: Calves soft  and non-tender. No peripheral edema or significant varicosities.   Pulses:  radial and DP pulses 2+ and equal bilaterally.    Integumentary: warm and dry, no jaundice, no rashes or lesions   Neurologic: alert and oriented, no focal weakness.         Impression and Management Plan     24 year old female with no prior medical history presenting to the emergency department for evaluation of left chest pain and left shoulder pain with onset this morning.  On examination  patient is nontoxic-appearing has normal vital signs, heart is regular, lungs are clear, no peripheral edema or calf tenderness elicited.  Will obtain basic laboratory studies, EKG, chest x-ray, will include a D-dimer given the pleuritic nature of her  pain.      DDx: Including but not limited to PE, ACS, musculoskeletal pain     Diagnostic Studies     Lab:      Recent Results (from the past 12 hour(s))     CBC WITH AUTOMATED DIFF          Collection Time: 09/04/20 10:07 AM         Result  Value  Ref Range            WBC  6.0  4.0 - 11.0 1000/mm3       RBC  5.03  3.60 - 5.20 M/uL       HGB  12.2 (L)  13.0 - 17.2 gm/dl       HCT  14.4  31.5 - 50.0 %       MCV  80.1  80.0 - 98.0 fL       MCH  24.3 (L)  25.4 - 34.6 pg       MCHC  30.3  30.0 - 36.0 gm/dl       PLATELET  400  867 - 450 1000/mm3       MPV  10.5 (H)  6.0 - 10.0 fL       RDW-SD  52.2 (H)  36.4 - 46.3         NRBC  0  0 - 0         IMMATURE GRANULOCYTES  0.2  0.0 - 3.0 %       NEUTROPHILS  51.2  34 - 64 %       LYMPHOCYTES  32.7  28 - 48 %        MONOCYTES  9.7  1 - 13 %       EOSINOPHILS  5.7 (H)  0 - 5 %       BASOPHILS  0.5  0 - 3 %       METABOLIC PANEL, BASIC          Collection Time: 09/04/20 10:07 AM         Result  Value  Ref Range            Sodium  142  136 - 145 mEq/L       Potassium  3.8  3.5 - 5.1 mEq/L       Chloride  111 (H)  98 - 107 mEq/L       CO2  27  21 - 32 mEq/L       Glucose  77  74 - 106 mg/dl       BUN  17  7 - 25 mg/dl       Creatinine  1.0  0.6 - 1.3 mg/dl  GFR est AA  >60.0          GFR est non-AA  >60          Calcium  9.3  8.5 - 10.1 mg/dl       Anion gap  4 (L)  5 - 15 mmol/L       TROPONIN-HIGH SENSITIVITY          Collection Time: 09/04/20 10:07 AM         Result  Value  Ref Range            Troponin-High Sensitivity  5  0 - 59 ng/L       D DIMER          Collection Time: 09/04/20 10:07 AM         Result  Value  Ref Range            D DIMER  <0.22  0.01 - 0.50 ug/mL (FEU)        Imaging:     XR CHEST SNGL V      Result Date: 09/04/2020   EXAM: XR CHEST SNGL V INDICATION: chest pain  COMPARISON: 03/10/2020 WORKSTATION ID: [id number] TECHNIQUE: AP view. FINDINGS: SUPPORT DEVICES: None. LUNGS/PLEURA: No consolidation or pleural effusion. HEART/MEDIASTINUM: Cardiomediastinal silhouette is  normal. OTHER: None.       IMPRESSION: No acute cardiopulmonary process.          ECG: Interpreted by Dr. Carmela Hurt.  Normal sinus rhythm, ventricular rate 85, PR interval 166, QRS 104, QTc 440.  No acute ischemic changes noted.      Imaging studies independently interpreted by myself and Dr. Carmela Hurt.  No cardiomegaly, acute infiltrate or other abnormality noted on chest x-ray     ED Course/Medical Decision Making     Patient has a normal white blood cell count 6.0, H&H 12.2 and 40.3, platelets 248, remainder of CBC is unremarkable.  Electrolytes are grossly normal, renal function is normal, creatinine  is 1.0, BUN is 17.  High-sensitivity troponin is 5, D-dimer is undetectable.  Chest x-ray and EKG are unremarkable as well.       Patient works for [employer], she does report that she does a good amount of heavy lifting, pushing, pulling, I am unable to reproduce her pain on exam but I do believe that this is most likely musculoskeletal in nature.  She has a heart score of 0, is PERC  negative with a negative D-dimer.  I do not see any utility of imaging at this time.  Provided with prescriptions for anti-inflammatories and muscle relaxers as well as a note for work, encouraged her to return with new or worsening symptoms otherwise  follow-up with primary care when possible.  She remained stable during her time in the ED and was discharged in good condition.          Final Diagnosis                 ICD-10-CM  ICD-9-CM          1.  Musculoskeletal chest pain   R07.89  786.59             Disposition     Discharged home      [place], [place]   September 04, 2020         The patient was personally evaluated by myself and discussed with Christiana Pellant, MD who agrees with the above assessment and plan.  My signature above authenticates this document and my orders, the final diagnosis (es), discharge prescription (s), and instructions in the Epic record. If you have any questions please contact [phone].       Nursing notes have been reviewed by the physician/ advanced practice clinician.

## 2020-09-04 NOTE — ED Notes (Signed)
1:36 PM  09/04/20     Discharge instructions given to [facility] (name) with verbalization of understanding. Patient accompanied by self.  Patient discharged with the following prescriptions robaxin and naproxen. Patient discharged to home (destination).      Fletcher Anon, RN

## 2020-10-25 DIAGNOSIS — U071 COVID-19: Secondary | ICD-10-CM

## 2020-10-25 NOTE — ED Notes (Signed)
Pt here for generalzied bodyache, cough, shortness of breath started on 26th of December    Pt had a positive test home kit for Covid.

## 2020-10-25 NOTE — ED Provider Notes (Signed)
ED Provider Notes by Dan Humphreys, PA-C at 10/25/20 2345                Author: Dan Humphreys, PA-C  Service: EMERGENCY  Author Type: Physician Assistant       Filed: 10/26/20 0044  Date of Service: 10/25/20 2345  Status: Attested           Editor: Foye Clock (Physician Assistant)  Cosigner: Johny Shock, DO at 10/26/20 0120          Attestation signed by Johny Shock, DO at 10/26/20 0120          I have discussed this case with the advanced practice provider. We have reviewed the presentation, pertinent historical and physical exam findings, as well  as relevant  findings. I have been available at all times and agree with the management and disposition documented here.      Johny Shock, DO   October 26, 2020                                    [facility]   Emergency Department Treatment Report          Patient: [name] Allen  Age: 24 y.o.  Sex: female          Date of Birth: 17-Nov-1995  Admit Date: 10/25/2020  PCP: Alfred Levins, MD     MRN: [id number]   CSN: [id number]   Attending: Johny Shock         Room: OTF/OTF  Time Dictated: 11:45 PM  APP:  Dan Humphreys, PA-C        Chief Complaint    Concern for COVID-19     History of Present Illness     24 y.o. female  presents complaining of fever, chills, sore throat and generalized body aches for the past 3 days. Denies cough, chest pain, nausea, vomiting, shortness of breath or difficulty breathing. She tested positive for Covid-19 at [facility] yesterday. She has  not received her Covid-19 vaccination. 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         Review of Systems        Constitutional: Positive fever, chills   Eyes: No visual symptoms.   ENT: Positive sore throat    Respiratory: No cough, dyspnea or wheezing.   Cardiovascular: No chest pain, palpitations,    Gastrointestinal: No vomiting, diarrhea or abdominal pain.   Genitourinary: No dysuria or  frequency    Musculoskeletal: Positive body aches    Integumentary: No rashes.   Neurological: No headaches   Denies complaints in all other systems.        Past Medical/Surgical History          Past Medical History:        Diagnosis  Date         ?  Asthma          No past surgical history on file.        Social History          Social History          Socioeconomic History         ?  Marital status:  SINGLE       Tobacco Use         ?  Smoking status:  Current Every Day Smoker  Packs/day:  1.00         ?  Smokeless tobacco:  Never Used       Vaping Use         ?  Vaping Use:  Never used       Substance and Sexual Activity         ?  Alcohol use:  Yes             Comment: every other day         ?  Drug use:  Yes              Types:  Marijuana         ?  Sexual activity:  Yes              Partners:  Male              Birth control/protection:  Condom, Injection          Family History          Family History         Problem  Relation  Age of Onset          ?  Diabetes  Maternal Grandmother            ?  Diabetes  Paternal Grandfather            Home Medications          Cannot display prior to admission medications because the patient has not been admitted in this contact.          Allergies     No Known Allergies        Physical Exam          ED Triage Vitals     ED Encounter Vitals Group           BP  132/85        Pulse  88        Resp  20        Temp  98.5        Temp src          SpO2  99% RA        Weight             Height             Constitutional: Patient appears well developed and well nourished. Appearance and behavior are age and situation appropriate.   HEENT: Conjunctiva clear. PERRLA. Mucous membranes moist, non-erythematous. Surface of the pharynx, palate, and tongue are pink, moist and  without lesions.   Neck: supple, non tender, symmetrical, no masses or JVD. No lymphadenopathy.   Respiratory: lungs clear to auscultation, nonlabored respirations. No tachypnea or accessory muscle use.    Cardiovascular: heart regular rate and rhythm without murmur rubs or gallops.     Integumentary: warm and dry without rashes or lesions   Neurologic: alert and oriented. No facial asymmetry or dysarthria. Moving all extremities well        Impression and Management Plan     24 y.o. female  presents complaining of fever, chills, sore throat and body aches with recent positive COVID-19 test yesterday at [facility] here requesting documentation for work.  Patient is afebrile and overall well-appearing, nontoxic.  Her vital signs are stable.   Her physical exam is benign.  Lung sounds are clear and equal bilaterally and no signs of respiratory distress or hypoxia.  She was able to show me documentation on her phone from her visit at [facility] pharmacy yesterday.  We will give a work note to  advise self quarantine at home.  Patient advised to return to the ED for new or worsening symptoms.        Diagnostic Studies     Lab:    No results found for this or any previous visit (from the past 12 hour(s)).        Final Diagnosis                  ICD-10-CM  ICD-9-CM          1.  COVID-19 virus infection   U07.1  079.89             Disposition     Patient stable for discharge home           Current Discharge Medication List               The patient was fully discussed with [facility], Iantha Fallen who agrees with the above assessment and plan         Luna Fuse PA-C   October 26, 2020      My signature above authenticates this document and my orders, the final ??   diagnosis (es), discharge prescription (s), and instructions in the Epic ??   record.   If you have any questions please contact [phone].   ??   Nursing notes have been reviewed by the physician/ advanced practice ??   Clinician.

## 2020-10-26 ENCOUNTER — Inpatient Hospital Stay
Admit: 2020-10-26 | Discharge: 2020-10-26 | Disposition: A | Discharge status: Home / Self Care | Payer: PRIVATE HEALTH INSURANCE

## 2020-10-26 NOTE — ED Notes (Signed)
3:12 AM  10/26/20     Discharge instructions given to paeitnt  (name) with verbalization of understanding. Patient accompanied by self.  Patient discharged with the following prescriptions neon. Patient discharged to home (destination).      Jena Gauss, RN

## 2021-04-09 ENCOUNTER — Emergency Department: Admit: 2021-04-09 | Payer: PRIVATE HEALTH INSURANCE | Primary: Pediatrics

## 2021-04-09 ENCOUNTER — Inpatient Hospital Stay
Admit: 2021-04-09 | Discharge: 2021-04-09 | Disposition: A | Discharge status: Home / Self Care | Payer: PRIVATE HEALTH INSURANCE | Attending: Emergency Medicine

## 2021-04-09 DIAGNOSIS — R0789 Other chest pain: Secondary | ICD-10-CM

## 2021-04-09 LAB — POC URINE MACROSCOPIC
Bilirubin, Urine: NEGATIVE
Bilirubin: NEGATIVE
Blood, Urine: NEGATIVE
Blood: NEGATIVE
Glucose, Ur: NEGATIVE mg/dl
Glucose: NEGATIVE mg/dl
Ketone: NEGATIVE mg/dl
Ketones, Urine: NEGATIVE mg/dl
Leukocyte Esterase, Urine: NEGATIVE
Leukocyte Esterase: NEGATIVE
Nitrite, Urine: NEGATIVE
Nitrites: NEGATIVE
Protein, UA: NEGATIVE mg/dl
Protein: NEGATIVE mg/dl
Specific Gravity, UA: 1.01 (ref 1.005–1.030)
Specific gravity: 1.01 (ref 1.005–1.030)
Urobilinogen, UA, POCT: 0.2 EU/dl (ref 0.0–1.0)
Urobilinogen: 0.2 EU/dl (ref 0.0–1.0)
pH (UA): 7 (ref 5–9)
pH, UA: 7 (ref 5–9)

## 2021-04-09 LAB — POC CHEM8
BUN: 10 mg/dl (ref 7–25)
BUN: 10 mg/dl (ref 7–25)
CALCIUM,IONIZED: 4.6 mg/dL (ref 4.40–5.40)
CALCIUM,IONIZED: 4.6 mg/dL (ref 4.40–5.40)
CO2 Total: 22 mmol/L (ref 21–32)
CO2, TOTAL: 22 mmol/L (ref 21–32)
Chloride: 105 mEq/L (ref 98–107)
Chloride: 105 mEq/L (ref 98–107)
Creatinine: 0.7 mg/dl (ref 0.6–1.3)
Creatinine: 0.7 mg/dl (ref 0.6–1.3)
Glucose: 87 mg/dL (ref 74–106)
Glucose: 87 mg/dL (ref 74–106)
HCT: 49 % — ABNORMAL HIGH (ref 38–45)
HGB: 16.7 gm/dl (ref 13.0–17.2)
Hematocrit: 49 % — ABNORMAL HIGH (ref 38–45)
Hemoglobin: 16.7 gm/dl (ref 13.0–17.2)
Potassium: 3.8 mEq/L (ref 3.5–4.9)
Potassium: 3.8 mEq/L (ref 3.5–4.9)
Sodium: 138 mEq/L (ref 136–145)
Sodium: 138 mEq/L (ref 136–145)

## 2021-04-09 LAB — CBC WITH AUTO DIFFERENTIAL
Basophils %: 0.3 % (ref 0–3)
Eosinophils %: 3.1 % (ref 0–5)
Hematocrit: 44.4 % (ref 37.0–50.0)
Hemoglobin: 14.1 gm/dl (ref 13.0–17.2)
Immature Granulocytes: 0.2 % (ref 0.0–3.0)
Lymphocytes %: 20.4 % — ABNORMAL LOW (ref 28–48)
MCH: 27.3 pg (ref 25.4–34.6)
MCHC: 31.8 gm/dl (ref 30.0–36.0)
MCV: 86 fL (ref 80.0–98.0)
MPV: 11.3 fL — ABNORMAL HIGH (ref 6.0–10.0)
Monocytes %: 8.5 % (ref 1–13)
Neutrophils %: 67.5 % — ABNORMAL HIGH (ref 34–64)
Nucleated RBCs: 0 (ref 0–0)
Platelets: 233 10*3/uL (ref 140–450)
RBC: 5.16 M/uL (ref 3.60–5.20)
RDW-SD: 52.1 — ABNORMAL HIGH (ref 36.4–46.3)
WBC: 9 10*3/uL (ref 4.0–11.0)

## 2021-04-09 LAB — D-DIMER, QUANTITATIVE: D-Dimer, Quant: 0.41 ug/mL (FEU) (ref 0.01–0.50)

## 2021-04-09 LAB — POC HCG,URINE
HCG urine, QL: NEGATIVE
Pregnancy Test(Urn): NEGATIVE

## 2021-04-09 LAB — TROPONIN, HIGH SENSITIVITY: Troponin, High Sensitivity: <3 ng/L (ref 0–59)

## 2021-04-09 LAB — CBC WITH AUTOMATED DIFF
BASOPHILS: 0.3 % (ref 0–3)
EOSINOPHILS: 3.1 % (ref 0–5)
HCT: 44.4 % (ref 37.0–50.0)
HGB: 14.1 gm/dl (ref 13.0–17.2)
IMMATURE GRANULOCYTES: 0.2 % (ref 0.0–3.0)
LYMPHOCYTES: 20.4 % — ABNORMAL LOW (ref 28–48)
MCH: 27.3 pg (ref 25.4–34.6)
MCHC: 31.8 gm/dl (ref 30.0–36.0)
MCV: 86 fL (ref 80.0–98.0)
MONOCYTES: 8.5 % (ref 1–13)
MPV: 11.3 fL — ABNORMAL HIGH (ref 6.0–10.0)
NEUTROPHILS: 67.5 % — ABNORMAL HIGH (ref 34–64)
NRBC: 0 (ref 0–0)
PLATELET: 233 10*3/uL (ref 140–450)
RBC: 5.16 M/uL (ref 3.60–5.20)
RDW-SD: 52.1 — ABNORMAL HIGH (ref 36.4–46.3)
WBC: 9 10*3/uL (ref 4.0–11.0)

## 2021-04-09 LAB — TROPONIN-HIGH SENSITIVITY: Troponin-High Sensitivity: <3 ng/L (ref 0–59)

## 2021-04-09 LAB — D DIMER: D DIMER: 0.41 ug/mL (FEU) (ref 0.01–0.50)

## 2021-04-09 MED ORDER — SODIUM CHLORIDE 0.9 % IJ SYRG
Freq: Once | INTRAMUSCULAR | Status: DC
Start: 2021-04-09 — End: 2021-04-09

## 2021-04-09 NOTE — ED Provider Notes (Signed)
[facility]  Emergency Department Treatment Report    Patient: [name] Allen Age: 25 y.o. Sex: female    Date of Birth: 1996/03/19 Admit Date: 04/09/2021 PCP: Alfred Levins, MD   MRN: [id number]  CSN: [id number]     Room: H12/H12 Time Dictated: 2:37 PM        Chief Complaint   Chest pain, shortness of breath, dizziness  History of Present Illness   25 y.o. female without significant past medical history other than asthma.  Presents with intermittent chest tightness over the past week or so.  Also reports shortness of breath.  Has had episodes of dizziness.  Denies loss of consciousness or syncope.  No fever, headache, cough, abdominal pain, edema.  Denies fall or injury.  No current medications.    Review of Systems   Review of Systems   Constitutional: Negative for chills and fever.   HENT: Negative for sore throat.    Respiratory: Positive for shortness of breath. Negative for cough.    Cardiovascular: Positive for chest pain. Negative for leg swelling.   Gastrointestinal: Negative for abdominal pain, nausea and vomiting.   Genitourinary: Negative for dysuria and flank pain.   Musculoskeletal: Negative for joint pain and myalgias.   Skin: Negative for itching and rash.   Neurological: Positive for dizziness. Negative for focal weakness and headaches.   Psychiatric/Behavioral: Negative for depression. The patient is not nervous/anxious.        Past Medical/Surgical History     Past Medical History:   Diagnosis Date   ??? Asthma      No past surgical history on file.    Social History     Social History     Socioeconomic History   ??? Marital status: SINGLE   Tobacco Use   ??? Smoking status: Current Every Day Smoker     Packs/day: 1.00   ??? Smokeless tobacco: Never Used   Vaping Use   ??? Vaping Use: Never used   Substance and Sexual Activity   ??? Alcohol use: Yes     Comment: every other day   ??? Drug use: Yes     Types: Marijuana   ??? Sexual activity: Yes     Partners: Male     Birth  control/protection: Condom, Injection       Family History     Family History   Problem Relation Age of Onset   ??? Diabetes Maternal Grandmother    ??? Diabetes Paternal Grandfather        Current Medications     Current Facility-Administered Medications   Medication Dose Route Frequency Provider Last Rate Last Admin   ??? sodium chloride (NS) flush 5-10 mL  5-10 mL IntraVENous ONCE Erling Conte, MD         Current Outpatient Medications   Medication Sig Dispense Refill   ??? methocarbamoL (Robaxin-750) 750 mg tablet Take 1 Tablet by mouth four (4) times daily as needed for Pain. 20 Tablet 0       Allergies   No Known Allergies    Physical Exam     ED Triage Vitals [04/09/21 1128]   ED Encounter Vitals Group      BP (!) 128/94      Pulse (Heart Rate) 91      Resp Rate 16      Temp 98.4 ??F (36.9 ??C)      Temp src       O2 Sat (%) 100 %  Weight       Height      Physical Exam  Vitals and nursing note reviewed.   Constitutional:       General: She is not in acute distress.     Appearance: She is well-developed.   HENT:      Head: Normocephalic and atraumatic.   Neck:      Vascular: No JVD.   Cardiovascular:      Rate and Rhythm: Normal rate and regular rhythm.      Heart sounds: Normal heart sounds.   Pulmonary:      Effort: Pulmonary effort is normal. No respiratory distress.      Breath sounds: Normal breath sounds. No stridor.   Abdominal:      General: Bowel sounds are normal.      Palpations: Abdomen is soft.      Tenderness: There is no abdominal tenderness.   Musculoskeletal:      Right lower leg: No edema.      Left lower leg: No edema.   Skin:     General: Skin is warm and dry.      Comments: Pea sized nodule mid sternum.  No indication of abscess.   Neurological:      Mental Status: She is alert and oriented to person, place, and time.      Motor: No abnormal muscle tone.   Psychiatric:         Behavior: Behavior normal.           Impression and Management Plan   Chest pain with shortness of breath dizziness.   Does have pleuritic component.  Heart rate greater than 90.  Otherwise low risk for Neri embolus.  Will check dimer.  Check chest x-ray, EKG, CBC, labs.  Reevaluate.      Diagnostic Studies   Lab:   Recent Results (from the past 12 hour(s))   CBC WITH AUTOMATED DIFF    Collection Time: 04/09/21  3:08 PM   Result Value Ref Range    WBC 9.0 4.0 - 11.0 1000/mm3    RBC 5.16 3.60 - 5.20 M/uL    HGB 14.1 13.0 - 17.2 gm/dl    HCT 54.5 62.5 - 63.8 %    MCV 86.0 80.0 - 98.0 fL    MCH 27.3 25.4 - 34.6 pg    MCHC 31.8 30.0 - 36.0 gm/dl    PLATELET 937 342 - 876 1000/mm3    MPV 11.3 (H) 6.0 - 10.0 fL    RDW-SD 52.1 (H) 36.4 - 46.3      NRBC 0 0 - 0      IMMATURE GRANULOCYTES 0.2 0.0 - 3.0 %    NEUTROPHILS 67.5 (H) 34 - 64 %    LYMPHOCYTES 20.4 (L) 28 - 48 %    MONOCYTES 8.5 1 - 13 %    EOSINOPHILS 3.1 0 - 5 %    BASOPHILS 0.3 0 - 3 %   D DIMER    Collection Time: 04/09/21  3:08 PM   Result Value Ref Range    D DIMER 0.41 0.01 - 0.50 ug/mL (FEU)   TROPONIN-HIGH SENSITIVITY    Collection Time: 04/09/21  3:08 PM   Result Value Ref Range    Troponin-High Sensitivity <3 0 - 59 ng/L   POC CHEM8    Collection Time: 04/09/21  3:15 PM   Result Value Ref Range    Sodium 138 136 - 145 mEq/L    Potassium 3.8 3.5 -  4.9 mEq/L    Chloride 105 98 - 107 mEq/L    CO2, TOTAL 22 21 - 32 mmol/L    Glucose 87 74 - 106 mg/dL    BUN 10 7 - 25 mg/dl    Creatinine 0.7 0.6 - 1.3 mg/dl    HCT 49 (H) 38 - 45 %    HGB 16.7 13.0 - 17.2 gm/dl    CALCIUM,IONIZED 2.54 4.40 - 5.40 mg/dL   POC HCG,URINE    Collection Time: 04/09/21  4:25 PM   Result Value Ref Range    HCG urine, QL negative NEGATIVE,Negative,negative     POC URINE MACROSCOPIC    Collection Time: 04/09/21  4:32 PM   Result Value Ref Range    Glucose Negative NEGATIVE,Negative mg/dl    Bilirubin Negative NEGATIVE,Negative      Ketone Negative NEGATIVE,Negative mg/dl    Specific gravity 2.706 1.005 - 1.030      Blood Negative NEGATIVE,Negative      pH (UA) 7.0 5 - 9      Protein Negative  NEGATIVE,Negative mg/dl    Urobilinogen 0.2 0.0 - 1.0 EU/dl    Nitrites Negative NEGATIVE,Negative      Leukocyte Esterase Negative NEGATIVE,Negative      Color Yellow      Appearance Clear         Imaging:    XR CHEST PA LAT    Result Date: 04/09/2021  Examination:  Frontal and lateral views of the chest. INDICATION: SOB COMPARISON: 09/04/2020 WORKSTATION ID: [id number] FINDINGS: No alveolar consolidations, congestive changes or pleural effusions. Cardiomediastinal silhouette normal in size and contour.     IMPRESSION: No acute cardiopulmonary process.       My Interpretation of imaging shows chest x-ray with no acute cardiopulmonary process.    EKG-normal sinus rhythm, rate 69 bpm.  Normal axis.  Normal intervals.  Normal ST and T waves.  Normal EKG.  Interpreted by Veverly Fells Kizzie Bane, MD    Medical Decision Making/ED Course   Patient remains medically stable to emergency department evaluation and observation.  EKG shows sinus rhythm without evidence of ischemia or arrhythmia.  Labs are unremarkable.  D-dimer is normal.  Chest x-ray is clear.  Vital signs normal stable for outpatient management and follow-up for further investigation.  We will have her follow-up with primary care.    Final Diagnosis       ICD-10-CM ICD-9-CM   1. Atypical chest pain  R07.89 786.59   2. SOB (shortness of breath)  R06.02 786.05     Disposition   Discharge    Erling Conte, M.D.  April 09, 2021    *Portions of this electronic record were dictated using Conservation officer, historic buildings.  Unintended errors in translation may occur.    My signature above authenticates this document and my orders, the final    diagnosis (es), discharge prescription (s), and instructions in the Epic    record.  If you have any questions please contact [phone].     Nursing notes have been reviewed by the physician/ advanced practice    Clinician.

## 2021-04-09 NOTE — ED Notes (Signed)
Pt. States she has been having SOB for a week, dizziness that started yesterday and also has a lump on chest that she noticed 03/12/2021

## 2021-04-09 NOTE — ED Notes (Signed)
I have reviewed discharge instructions with the patient.  The patient verbalized understanding.

## 2021-04-09 NOTE — ED Notes (Signed)
Report given to Shannon, RN

## 2021-04-10 LAB — EKG 12-LEAD
Atrial Rate: 69 {beats}/min
Diagnosis: NORMAL
P Axis: 76 degrees
P-R Interval: 182 ms
Q-T Interval: 412 ms
QRS Duration: 106 ms
QTc Calculation (Bazett): 441 ms
R Axis: 80 degrees
T Axis: 67 degrees
Ventricular Rate: 69 {beats}/min

## 2021-04-10 LAB — EKG, 12 LEAD, INITIAL
Atrial Rate: 69 {beats}/min
Calculated P Axis: 76 degrees
Calculated R Axis: 80 degrees
Calculated T Axis: 67 degrees
Diagnosis: NORMAL
P-R Interval: 182 ms
Q-T Interval: 412 ms
QRS Duration: 106 ms
QTC Calculation (Bezet): 441 ms
Ventricular Rate: 69 {beats}/min

## 2021-09-07 ENCOUNTER — Inpatient Hospital Stay
Admit: 2021-09-07 | Discharge: 2021-09-07 | Disposition: A | Discharge status: Home / Self Care | Payer: PRIVATE HEALTH INSURANCE | Attending: Emergency Medicine

## 2021-09-07 DIAGNOSIS — N898 Other specified noninflammatory disorders of vagina: Secondary | ICD-10-CM

## 2021-09-07 DIAGNOSIS — N760 Acute vaginitis: Secondary | ICD-10-CM

## 2021-09-07 LAB — POC CHEM8
BUN: 13 mg/dl (ref 7–25)
BUN: 13 mg/dl (ref 7–25)
CALCIUM,IONIZED: 4.8 mg/dL (ref 4.40–5.40)
CALCIUM,IONIZED: 4.8 mg/dL (ref 4.40–5.40)
CO2 Total: 28 mmol/L (ref 21–32)
CO2, TOTAL: 28 mmol/L (ref 21–32)
Chloride: 102 mEq/L (ref 98–107)
Chloride: 102 mEq/L (ref 98–107)
Creatinine: 1 mg/dl (ref 0.6–1.3)
Creatinine: 1 mg/dl (ref 0.6–1.3)
Glucose: 85 mg/dL (ref 74–106)
Glucose: 85 mg/dL (ref 74–106)
HCT: 52 % — ABNORMAL HIGH (ref 38–45)
HGB: 17.7 gm/dl — ABNORMAL HIGH (ref 13.0–17.2)
Hematocrit: 52 % — ABNORMAL HIGH (ref 38–45)
Hemoglobin: 17.7 gm/dl — ABNORMAL HIGH (ref 13.0–17.2)
Potassium: 3.4 mEq/L — ABNORMAL LOW (ref 3.5–4.9)
Potassium: 3.4 mEq/L — ABNORMAL LOW (ref 3.5–4.9)
Sodium: 140 mEq/L (ref 136–145)
Sodium: 140 mEq/L (ref 136–145)

## 2021-09-07 LAB — BETA HCG, QT
HCG, BETA, HCGTLT: <3 m[IU]/mL (ref 0–10)
HCG, beta, QT: <3 m[IU]/mL (ref 0–10)

## 2021-09-07 LAB — CBC WITH AUTO DIFFERENTIAL
Basophils %: 0.4 % (ref 0–3)
Eosinophils %: 4.6 % (ref 0–5)
Hematocrit: 47.5 % (ref 37.0–50.0)
Hemoglobin: 15 gm/dl (ref 13.0–17.2)
Immature Granulocytes: 0.3 % (ref 0.0–3.0)
Lymphocytes %: 30 % (ref 28–48)
MCH: 28.1 pg (ref 25.4–34.6)
MCHC: 31.6 gm/dl (ref 30.0–36.0)
MCV: 89 fL (ref 80.0–98.0)
MPV: 11.8 fL — ABNORMAL HIGH (ref 6.0–10.0)
Monocytes %: 6.7 % (ref 1–13)
Neutrophils %: 58 % (ref 34–64)
Nucleated RBCs: 0 (ref 0–0)
Platelets: 219 10*3/uL (ref 140–450)
RBC: 5.34 M/uL — ABNORMAL HIGH (ref 3.60–5.20)
RDW-SD: 48.7 — ABNORMAL HIGH (ref 36.4–46.3)
WBC: 7.9 10*3/uL (ref 4.0–11.0)

## 2021-09-07 LAB — WET PREP

## 2021-09-07 LAB — CBC WITH AUTOMATED DIFF
BASOPHILS: 0.4 % (ref 0–3)
EOSINOPHILS: 4.6 % (ref 0–5)
HCT: 47.5 % (ref 37.0–50.0)
HGB: 15 gm/dl (ref 13.0–17.2)
IMMATURE GRANULOCYTES: 0.3 % (ref 0.0–3.0)
LYMPHOCYTES: 30 % (ref 28–48)
MCH: 28.1 pg (ref 25.4–34.6)
MCHC: 31.6 gm/dl (ref 30.0–36.0)
MCV: 89 fL (ref 80.0–98.0)
MONOCYTES: 6.7 % (ref 1–13)
MPV: 11.8 fL — ABNORMAL HIGH (ref 6.0–10.0)
NEUTROPHILS: 58 % (ref 34–64)
NRBC: 0 (ref 0–0)
PLATELET: 219 10*3/uL (ref 140–450)
RBC: 5.34 M/uL — ABNORMAL HIGH (ref 3.60–5.20)
RDW-SD: 48.7 — ABNORMAL HIGH (ref 36.4–46.3)
WBC: 7.9 10*3/uL (ref 4.0–11.0)

## 2021-09-07 MED ORDER — SODIUM CHLORIDE 0.9 % IJ SYRG
Freq: Once | INTRAMUSCULAR | Status: DC
Start: 2021-09-07 — End: 2021-09-07

## 2021-09-07 MED ORDER — METRONIDAZOLE 250 MG TAB
250 mg | ORAL | Status: AC
Start: 2021-09-07 — End: 2021-09-07
  Administered 2021-09-07: via ORAL

## 2021-09-07 MED ORDER — METRONIDAZOLE 500 MG TAB
500 mg | ORAL_TABLET | Freq: Two times a day (BID) | ORAL | 0 refills | Status: AC
Start: 2021-09-07 — End: 2021-09-14

## 2021-09-07 MED FILL — METRONIDAZOLE 250 MG TAB: 250 mg | ORAL | Qty: 2

## 2021-09-07 NOTE — ED Notes (Signed)
Pt presents to ED with c/o abd pain.  Pt reports she had an abortion about 3 weeks ago and is still having intense pain.

## 2021-09-07 NOTE — ED Notes (Signed)
Patient ambulatory to split flow, patient eating chik filet, patient laughing with staff

## 2021-09-07 NOTE — ED Notes (Signed)
Patient stated understanding of discharge instructions. Patient was ambulatory upon discharge. Patient received one prescription.     Patient armband removed and shredded

## 2021-09-07 NOTE — Progress Notes (Signed)
Rounded on Pt. Pt resting in recliner, speaking on cell phone and eating chik fil a. No needs or concerns expressed at this time.

## 2021-09-07 NOTE — ED Provider Notes (Signed)
[facility]  Emergency Department Treatment Report    Patient: [name] Allen Age: 25 y.o. Sex: female    Date of Birth: Aug 17, 1996 Admit Date: 09/07/2021 PCP: Alfred Levins, MD   MRN: [id number]  CSN: [id number]     Room: ER47/ER47 Time Dictated: 5:02 PM        Chief Complaint   Vaginal discharge, pelvic pain  History of Present Illness   25 y.o. female status post medical AB in early pregnancy approximately 3 weeks ago.  Having vaginal discharge.  Also reports vaginal pain.  Denies bleeding or passage of tissue.  There has been no fever, dysuria, diarrhea, constipation, nausea, vomiting.  AB was with a pill vaginal suppository.  Patient unsure of medication but likely mifepristone/misopristol.  She denies injury.    Review of Systems   Review of Systems   Constitutional:  Negative for chills and fever.   HENT:  Negative for sore throat.    Respiratory:  Negative for cough and shortness of breath.    Cardiovascular:  Negative for chest pain and leg swelling.   Gastrointestinal:  Negative for abdominal pain, nausea and vomiting.   Genitourinary:  Negative for dysuria and flank pain.        Vaginal pain, vaginal discharge.  No vaginal bleeding.   Musculoskeletal:  Negative for joint pain and myalgias.   Skin:  Negative for itching and rash.   Neurological:  Negative for dizziness, focal weakness and headaches.   Psychiatric/Behavioral:  Negative for depression. The patient is not nervous/anxious.      Past Medical/Surgical History     Past Medical History:   Diagnosis Date    Asthma      History reviewed. No pertinent surgical history.    Social History     Social History     Socioeconomic History    Marital status: SINGLE   Tobacco Use    Smoking status: Every Day     Packs/day: 0.25     Types: Cigarettes    Smokeless tobacco: Never   Vaping Use    Vaping Use: Never used   Substance and Sexual Activity    Alcohol use: Yes     Comment: every other day    Drug use: Yes     Types: Marijuana     Sexual activity: Yes     Partners: Male     Birth control/protection: Condom, Injection       Family History     Family History   Problem Relation Age of Onset    Diabetes Maternal Grandmother     Diabetes Paternal Grandfather        Current Medications     Current Facility-Administered Medications   Medication Dose Route Frequency Provider Last Rate Last Admin    sodium chloride (NS) flush 5-10 mL  5-10 mL IntraVENous ONCE Erling Conte, MD           Allergies   No Known Allergies    Physical Exam     ED Triage Vitals   ED Encounter Vitals Group      BP 09/07/21 1350 116/83      Pulse (Heart Rate) 09/07/21 1350 77      Resp Rate 09/07/21 1350 16      Temp 09/07/21 1350 98.7 ??F (37.1 ??C)      Temp src --       O2 Sat (%) 09/07/21 1350 99 %      Weight 09/07/21 1439  150 lb      Height 09/07/21 1439 5\' 5"      Physical Exam  Vitals and nursing note reviewed.   Constitutional:       General: She is not in acute distress.     Appearance: She is well-developed.   HENT:      Head: Normocephalic and atraumatic.   Cardiovascular:      Rate and Rhythm: Normal rate and regular rhythm.      Heart sounds: Normal heart sounds.   Pulmonary:      Effort: Pulmonary effort is normal. No respiratory distress.      Breath sounds: Normal breath sounds.   Abdominal:      General: Bowel sounds are normal.      Palpations: Abdomen is soft.      Tenderness: There is no abdominal tenderness.   Genitourinary:     Comments: Pelvic exam-no cervical motion tenderness or adnexal tenderness or masses.  Vaginal discharge.  No bleeding.  Vaginal mucosa intact.  Musculoskeletal:         General: No deformity.   Skin:     General: Skin is warm and dry.   Neurological:      Mental Status: She is alert and oriented to person, place, and time.      Motor: No abnormal muscle tone.   Psychiatric:         Behavior: Behavior normal.         Impression and Management Plan   Medical AB 3 weeks prior.  Having discharge and vaginal pain.  No bleeding.  Exam is  benign we will check quantitative hCG as well as wet prep and GC chlamydia.    Diagnostic Studies   Lab:   Recent Results (from the past 12 hour(s))   CBC WITH AUTOMATED DIFF    Collection Time: 09/07/21  3:45 PM   Result Value Ref Range    WBC 7.9 4.0 - 11.0 1000/mm3    RBC 5.34 (H) 3.60 - 5.20 M/uL    HGB 15.0 13.0 - 17.2 gm/dl    HCT 13/11/22 83.6 - 62.9 %    MCV 89.0 80.0 - 98.0 fL    MCH 28.1 25.4 - 34.6 pg    MCHC 31.6 30.0 - 36.0 gm/dl    PLATELET 47.6 546 - 503 1000/mm3    MPV 11.8 (H) 6.0 - 10.0 fL    RDW-SD 48.7 (H) 36.4 - 46.3      NRBC 0 0 - 0      IMMATURE GRANULOCYTES 0.3 0.0 - 3.0 %    NEUTROPHILS 58.0 34 - 64 %    LYMPHOCYTES 30.0 28 - 48 %    MONOCYTES 6.7 1 - 13 %    EOSINOPHILS 4.6 0 - 5 %    BASOPHILS 0.4 0 - 3 %   BETA HCG, QT    Collection Time: 09/07/21  3:45 PM   Result Value Ref Range    HCG, beta, QT <3 0 - 10 mIU/ml   POC CHEM8    Collection Time: 09/07/21  3:49 PM   Result Value Ref Range    Sodium 140 136 - 145 mEq/L    Potassium 3.4 (L) 3.5 - 4.9 mEq/L    Chloride 102 98 - 107 mEq/L    CO2, TOTAL 28 21 - 32 mmol/L    Glucose 85 74 - 106 mg/dL    BUN 13 7 - 25 mg/dl    Creatinine 1.0 0.6 - 1.3  mg/dl    HCT 52 (H) 38 - 45 %    HGB 17.7 (H) 13.0 - 17.2 gm/dl    CALCIUM,IONIZED 1.76 4.40 - 5.40 mg/dL       Imaging:    No results found.      Medical Decision Making/ED Course   Patient remains medically stable through emergency dept evaluation and observation.  Pelvic exam benign.  Undetectable hcg.  Vaginal discharge and e/o BV on wet prep.  Treating with Flagyl.  GC/chlamydia sent.  Pt does not wish to have empiric treatment and will await results.  Treat if positive results.  Outpt gyn f/up.    Final Diagnosis       ICD-10-CM ICD-9-CM   1. Vaginal discharge  N89.8 623.5   2. Pelvic pain  R10.2 IMO0002   3. BV (bacterial vaginosis)  N76.0 616.10    B96.89 041.9     Disposition   Discharge    Erling Conte, M.D.  September 07, 2021    *Portions of this electronic record were dictated using [employer].  Unintended errors in translation may occur.    My signature above authenticates this document and my orders, the final    diagnosis (es), discharge prescription (s), and instructions in the Epic    record.  If you have any questions please contact [phone].     Nursing notes have been reviewed by the physician/ advanced practice    Clinician.

## 2021-09-08 LAB — CT/GC DNA
CHLAMYDIA TRACHOMATIS DNA, CTDNA: NOT DETECTED
CHLAMYDIA TRACHOMATIS DNA: NOT DETECTED
NEISSERIA GONORRHOEAE DNA, GCDNA: NOT DETECTED
NEISSERIA GONORRHOEAE DNA: NOT DETECTED

## 2021-12-27 ENCOUNTER — Emergency Department: Admit: 2021-12-27 | Primary: Pediatrics

## 2021-12-27 ENCOUNTER — Inpatient Hospital Stay
Admit: 2021-12-27 | Discharge: 2021-12-27 | Disposition: A | Discharge status: Home / Self Care | Attending: Emergency Medicine

## 2021-12-27 DIAGNOSIS — S6391XA Sprain of unspecified part of right wrist and hand, initial encounter: Secondary | ICD-10-CM

## 2021-12-27 LAB — WET PREP, GENITAL

## 2021-12-27 LAB — POC PREGNANCY UR-QUAL: Pregnancy, Urine: NEGATIVE

## 2021-12-27 LAB — C.TRACHOMATIS N.GONORRHOEAE DNA
C. trachomatis DNA: NOT DETECTED
NEISSERIA GONORRHOEAE, DNA: NOT DETECTED

## 2021-12-27 MED ORDER — METRONIDAZOLE 500 MG PO TABS
500 MG | ORAL_TABLET | Freq: Two times a day (BID) | ORAL | 0 refills | Status: AC
Start: 2021-12-27 — End: 2022-01-03

## 2021-12-27 NOTE — Discharge Instructions (Addendum)
Pain in wrist splint.  Ice and elevate above the heart.    Take full course of antibiotic as prescribed.  No alcohol while on Flagyl.  Call your primary care doctor for follow-up appointment.  Return to the emergency department anytime if new or worsening symptoms.

## 2021-12-27 NOTE — ED Notes (Signed)
Pt discharged.     Discussed prescriptions, follow up care and went over care instructions.     Pt given the opportunity to ask questions.     Pt verbalized understating.     Pt ambulatory out of the department        Nickola Major, LPN  20/25/42 [id number]

## 2021-12-27 NOTE — ED Notes (Signed)
Pt ambulatory from triage to vertical care with steady gait      Pt stating she may be pregnant and wants to be checked and also stated she was tested at the [facility] department for STDs and she needs the medication for it, but is uncertain about what she was tested for. Pt stating she was tested on Monday then said she wasn't tested on Monday and that she got results on Monday. Pt then she said she was actually tested in November and needs the prescription for back in November because she never took it.      Nickola Major, LPN  17/00/17 [id number]
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
## 2022-11-22 NOTE — ED Notes (Signed)
Formatting of this note might be different from the original.  Pain assessment on discharge was tolerable.  Condition stable.  Patient discharged to home.  Patient education was completed:  yes  Education taught to:  patient and partner  Teaching method used was discussion and handout.  Understanding of teaching was good.  Patient was discharged ambulatory.  Discharged with family.  Valuables were given to: patient.    Electronically signed by Berna Spare, RN at 11/22/2022  3:23 AM EST

## 2022-11-22 NOTE — ED Triage Notes (Signed)
Formatting of this note might be different from the original.  Pt states she was having and anxiety attack, has H/O the same, c/o fingers being numb, pt was hyperventilating upon arrival   Electronically signed by Theodora Blow, RN at 11/22/2022 12:54 AM EST

## 2022-11-22 NOTE — ED Provider Notes (Signed)
Formatting of this note is different from the original.  [facility]    Time of Arrival:   11/22/22 0050     Final diagnoses:   [F41.0] Anxiety attack (Primary)   [Z76.0] Encounter for medication refill     Medical Decision Making:      Differential Diagnosis:     Shortness of breath, dizziness, palpitations: Consider anxiety, arrhythmia, electrolyte abnormality, asthma                              ED Course/Consults:   Patient is a 27 year old female who presents to the ER with complaints of shortness of breath, palpitations, dizziness.  States that she has a history of anxiety disorder and she was doing something that she thinks triggered her anxiety.  She is now feeling better and has no symptoms.    In triage vital signs reassuring with exception of a heart rate around 109.  However, on EKG heart rate within normal limits.    EKG with normal sinus rhythm  BMP reassuring    Patient states her symptoms have resolved.  States she does not like to take medicines for anxiety and does not feel like she wants any now.  She is requesting a refill on her albuterol inhaler.  Currently on exam no evidence of wheezing or asthma exacerbation.  Will refill prescription.    Instructed patient to follow-up with primary care as outpatient.  Long discussion with patient regarding strict return precautions.  All questions answered.    Evaluated and treated the patient under the supervision of ED attending, Dr. Dawayne Patricia, prior to discharge. Attending agreed with current plan of care.     Note written with Dragon dictation. Due to voice recognition software, sound alike and misspelled words may be contained in the documentation         Documentation/Prior Results Review:  Old medical records, Nursing notes    Imaging Interpreted by me: Not Applicable    EKG [id number]   Final Result       Disposition:  Home    .    Discharge Medication List as of 11/22/2022  3:07 AM       START taking these medications     Details   albuterol (PROVENTIL, VENTOLIN, PROAIR) 90 mcg/actuation INH HFAA inhaler Take 1-2 Puffs inhaled by mouth Every 4 Hours As Needed., Disp-6.7 g, R-0, Print         Chief Complaint   Patient presents with    Elizabeth     Patient is a 27 year old female who presents to the ER for anxiety attack.  States that prior to coming in the hospital she was willingly doing something where something was around her throat.  She has not clear with details.  However, states that she then became anxious and felt like she could not breathe.  She states she has a history of panic attacks and this felt similar.  She is now feeling better.  She states that her heart was racing and she was hyperventilating.  Currently no shortness of breath or chest pain.  Denies any voice changes, difficulty swallowing.  No history of DVT or PE.  Not on birth control.  No lower extremity pain or swelling.  No history of long immobilization.    Patient also requesting a refill on her albuterol inhaler.    Review of Systems     Physical Exam  Vitals  reviewed.   Constitutional:       Appearance: Normal appearance.   HENT:      Head: Normocephalic and atraumatic.      Right Ear: External ear normal.      Left Ear: External ear normal.      Nose: Nose normal.      Mouth/Throat:      Mouth: Mucous membranes are moist.      Pharynx: Oropharynx is clear.   Eyes:      Extraocular Movements: Extraocular movements intact.      Conjunctiva/sclera: Conjunctivae normal.      Pupils: Pupils are equal, round, and reactive to light.   Cardiovascular:      Rate and Rhythm: Normal rate and regular rhythm.      Pulses: Normal pulses.   Pulmonary:      Effort: Pulmonary effort is normal. No respiratory distress.      Breath sounds: Normal breath sounds. No wheezing.   Abdominal:      General: Bowel sounds are normal. There is no distension.      Palpations: Abdomen is soft.      Tenderness: There is no abdominal tenderness. There is no right CVA tenderness,  left CVA tenderness or guarding.   Musculoskeletal:         General: Normal range of motion.      Cervical back: Normal range of motion and neck supple.   Skin:     General: Skin is warm and dry.   Neurological:      General: No focal deficit present.      Mental Status: She is alert and oriented to person, place, and time.   Psychiatric:         Judgment: Judgment normal.     Past Medical History:   Diagnosis Date    Headache disorder     Multiple gestation      No past surgical history on file.  Family History   Problem Relation Age of Onset    Cancer Maternal Grandmother     Diabetes Maternal Grandmother     Diabetes Paternal Grandmother     Heart Failure Paternal Grandmother      Social History     Occupational History    Not on file   Tobacco Use    Smoking status: Never    Smokeless tobacco: Never   Substance and Sexual Activity    Alcohol use: No    Drug use: No    Sexual activity: Not on file     Outpatient Medications Marked as Taking for the 11/22/22 encounter [facility] Encounter)   Medication Sig Dispense Refill    albuterol (PROVENTIL, VENTOLIN, PROAIR) 90 mcg/actuation INH HFAA inhaler Take 1-2 Puffs inhaled by mouth Every 4 Hours As Needed. 6.7 g 0     No Known Allergies    Vital Signs:  No data found.    Diagnostics:  Labs:    Results for orders placed or performed during the hospital encounter of 11/22/22   Chem8, i-STAT (Lab)   Result Value Ref Range    POTASSIUM 4.9 3.5 - 5.5 mmol/L    CHLORIDE 108 98 - 110 mmol/L    CALCIUM IONIZED 4.2 (L) 4.4 - 5.4 mg/dL    CO2 22.0 20.0 - 32.0 mmol/L    Glucose 94 70 - 99 mg/dL    BUN 17 6 - 22 mg/dL    CREATININE 1.0 0.5 - 1.2 mg/dL    SODIUM 142 133 -  145 mmol/L    HGB 16.0 (H) 11.7 - 15.5 g/dL    HCT 47.0 (H) 35.1 - 46.5 %    POC-eGFR >60.0 >60.0    Cartridge type CHEM8+      Medications given in the ED  Medications   ibuprofen (Advil;Motrin) tablet 600 mg (600 mg Oral Given 11/22/22 [id number])       Electronically signed by Dory Larsen, MD at 12/05/2022  1:20 AM  EST

## 2022-12-10 ENCOUNTER — Inpatient Hospital Stay
Admit: 2022-12-10 | Discharge: 2022-12-11 | Disposition: A | Discharge status: Home / Self Care | Payer: BLUE CROSS/BLUE SHIELD | Attending: Emergency Medicine

## 2022-12-10 DIAGNOSIS — R519 Headache, unspecified: Secondary | ICD-10-CM

## 2022-12-10 NOTE — ED Provider Notes (Signed)
[place]  Emergency Department Treatment Report    Patient: [name] Allen Age: 27 y.o. Sex: female    Date of Birth: Apr 28, 1996 Admit Date: 12/10/2022 PCP: Tammy Sours, MD   MRN: [id number]  CSN: [id number]  ATTENDING: Susa Raring, MD    Room: FCWR/FCWR Time Dictated: 9:02 PM APP: Larinda Buttery, PA-C     Chief Complaint   Chief Complaint   Patient presents with    Headache    Concern For COVID-19       History of Present Illness   27 y.o. female who reports no pertinent past medical history presenting to the emergency department for headache for the last 2 weeks.  She states she has been having sharp pains that come and go all over her head for the last 2 weeks and has been taking BC powders daily since then.  She does states that her boyfriend was diagnosed with COVID recently and that could be the cause.  Also reports that she drinks very heavily which she equates to about a bottle of tequila every single day and acknowledges that this could also be the cause.  Reports "knots "on the back of her head as well but she cannot feel them currently.  Also believes this could be caused by eyestrain as she states she has glasses but does not wear them so she has to strain to see clearly.  Does acknowledge some lightheadedness and feeling off balance earlier in the day but denies syncope or falls.  Acknowledges some nausea with the pain that comes and goes.  Denies fevers, chest pain, shortness of breath, abdominal pain, vomiting, vision changes, irritative voiding symptoms, hematuria, or blood in stool.    Review of Systems   See HPI    Past Medical/Surgical History     Past Medical History:   Diagnosis Date    Asthma      No past surgical history on file.    Social History     Social History     Socioeconomic History    Marital status: Single   Tobacco Use    Smoking status: Every Day     Current packs/day: 0.25     Types: Cigarettes    Smokeless tobacco: Never   Substance and Sexual Activity     Alcohol use: Yes    Drug use: Yes     Types: Marijuana [facility])       Family History     Family History   Problem Relation Age of Onset    Diabetes Paternal Grandfather     Diabetes Maternal Grandmother        Current Medications     Previous Medications    No medications on file         Allergies   Patient has no known allergies.      Physical Exam     ED Triage Vitals [12/10/22 1610]   BP Temp Temp Source Pulse Respirations SpO2 Height Weight - Scale   131/80 98.1 F (36.7 C) Oral 81 18 100 % 1.626 m (5\' 4" ) 68 kg (150 lb)           Physical Exam  Constitutional:       General: She is not in acute distress.     Appearance: Normal appearance. She is not ill-appearing, toxic-appearing or diaphoretic.   HENT:      Head: Normocephalic and atraumatic.   Eyes:      Extraocular Movements: Extraocular movements  intact.   Cardiovascular:      Rate and Rhythm: Normal rate and regular rhythm.      Heart sounds: No murmur heard.     No friction rub. No gallop.   Pulmonary:      Effort: Pulmonary effort is normal. No respiratory distress.      Breath sounds: Normal breath sounds. No stridor. No wheezing, rhonchi or rales.   Abdominal:      General: Abdomen is flat. There is no distension.      Palpations: Abdomen is soft.      Tenderness: There is no abdominal tenderness. There is no guarding.   Musculoskeletal:         General: Normal range of motion.      Cervical back: Normal range of motion and neck supple.   Skin:     General: Skin is warm and dry.   Neurological:      General: No focal deficit present.      Mental Status: She is alert and oriented to person, place, and time.      GCS: GCS eye subscore is 4. GCS verbal subscore is 5. GCS motor subscore is 6.      Cranial Nerves: No cranial nerve deficit, dysarthria or facial asymmetry.      Sensory: No sensory deficit.      Motor: No weakness, tremor, seizure activity or pronator drift.      Coordination: Coordination normal. Finger-Nose-Finger Test normal.      Gait:  Gait normal.   Psychiatric:         Mood and Affect: Mood normal.         Behavior: Behavior normal.         Impression and Management Plan   Patient is a 27 year old female presented the emergency department for headache for 2 weeks.  She is neurologically intact, no obvious focal deficits.  Will treat symptoms and check basic labs as well as swab for COVID and flu.    DDx: Including but not limited to tension headache versus migraine versus headache secondary to eyestrain versus alcohol withdrawal versus electrolyte abnormality versus COVID versus influenza versus other viral syndrome versus CVA  Diagnostic Studies     Results for orders placed or performed during the hospital encounter of 12/10/22   COVID-19, Flu A/B, and RSV Combo    Specimen: Nasopharyngeal Swab; Nasal    Narrative    Is this test for diagnosis or screening?->Diagnosis of ill patient  Symptomatic for COVID-19 as defined by CDC?->Unknown  Date of Symptom Onset->N/A  Hospitalized for COVID-19?->No  Admitted to ICU for COVID-19?->No   POC CHEM 8   Result Value Ref Range    Sodium 139 136 - 145 mEq/L    Potassium 3.9 3.5 - 4.9 mEq/L    Chloride 104 98 - 107 mEq/L    Total CO2 24 21 - 32 mmol/L    Glucose 81 74 - 106 mg/dL    BUN 21 7 - 25 mg/dl    Creatinine 1.0 0.6 - 1.3 mg/dl    Hematocrit 48 (H) 38 - 45 %    Hemoglobin 16.3 13.0 - 17.2 gm/dl    Calcium, Ionized 4.70 4.40 - 5.40 mg/dL       ED Course/Medical Decision Making   Patient initially stating that she is concerned she may have an aneurysm as many people in her family have them.  States that she wanted to have a scan and have her head due to  her headaches.  Discussed at length with the patient the role of imaging for headaches, CT scan would not show a brain aneurysm.  Instructed the patient to follow-up with her primary care for an MRI based on family history.  She is neurologically intact with no focal deficits.  Will treat the headache and reassess patient's symptoms.  Patient is  agreeable to this plan and demonstrates understanding.    Discussed alcohol consumption with the patient who states that she is going to a rehab facility in [place] with her sisters soon.  Instructed patient to taper off her alcohol due to her large volume of consumption and not to cut out alcohol cold [place] due to risk of delirium tremens and seizure activity.    Patient states she is feeling better and would like to go home after receiving medications for her headache, her headache has resolved at this time.  Instructed follow-up with her primary care or return to the emergency department she has new or worsening symptoms.    Patient remained stable in the emergency department, did not develop symptoms, informed of results, and discharged in stable condition.      ED Course as of 12/10/22 2102   Tue Dec 10, 2022   2001 POC CHEM 8(!):    Sodium 139   Potassium 3.9   Chloride 104   Total CO2 24   Glucose, Random 81   BUN,BUNPL 21   Creatinine 1.0   Hematocrit 48(!)   Hemoglobin Quant 16.3   Calcium, Ionized 4.70 [EH]      ED Course User Index  [EH] Sherri Rad, [place]        RECORDS REVIEWED:  I reviewed the patient's previous records here at [facility] and available outside facilities and note that patient was seen in the emergency department January 26 for an anxiety attack    EXTERNAL RESULTS REVIEWED: None    INDEPENDENT HISTORIAN:  History and/or plan development assisted by: None    Severe exacerbation or progression of chronic illness: None      Threat to body function without evaluation and management: Neurological, infectious      SOCIAL DETERMINANTS  impacting Evaluation and Management: Provider availability, stress, health literacy      Comorbidities impacting Evaluation and Management: None    Medications   acetaminophen (OFIRMEV) infusion 1,000 mg (1,000 mg IntraVENous New Bag 12/10/22 1949)   metoclopramide (REGLAN) injection 10 mg (10 mg IntraVENous Given 12/10/22 1948)   diphenhydrAMINE (BENADRYL)  injection 25 mg (25 mg IntraVENous Given 12/10/22 1948)     Final Diagnosis     1. Acute nonintractable headache, unspecified headache type    2. Alcohol dependence with unspecified alcohol-induced disorder [facility])        Disposition   Discharge    Larinda Buttery, PA-C  December 10, 2022    The patient was personally evaluated by myself and discussed with Susa Raring, MD who agrees with the above assessment and plan.    My signature above authenticates this document and my orders, the final diagnosis (es), discharge prescription (s), and instructions in the Epic record. If you have any questions please contact [phone].     Nursing notes have been reviewed by the physician/ advanced practice clinician.    Dragon medical dictation software was used for portions of this report. Unintended voice recognition errors may occur.  Gloriann Loan, [place]  12/10/22 2104

## 2022-12-10 NOTE — Discharge Instructions (Addendum)
Utilize over-the-counter Tylenol and ibuprofen as needed for headache.  Follow-up with your primary care provider this week and discuss possible need for MRI with family history of aneurysm.  Highly encourage you to keep your plan to go to rehab for alcohol dependence.  If you would like to decrease your alcohol intake be sure to taper and the amount that you are drinking and not quit cold [place] as you can induce seizures and delirium.  Drink plenty of fluids and eat a balanced diet.  Return to the emergency department if you have new or worsening symptoms.    Results for orders placed or performed during the hospital encounter of 12/10/22   COVID-19, Flu A/B, and RSV Combo    Specimen: Nasopharyngeal Swab; Nasal    Narrative    Is this test for diagnosis or screening?->Diagnosis of ill patient  Symptomatic for COVID-19 as defined by CDC?->Unknown  Date of Symptom Onset->N/A  Hospitalized for COVID-19?->No  Admitted to ICU for COVID-19?->No   POC CHEM 8   Result Value Ref Range    Sodium 139 136 - 145 mEq/L    Potassium 3.9 3.5 - 4.9 mEq/L    Chloride 104 98 - 107 mEq/L    Total CO2 24 21 - 32 mmol/L    Glucose 81 74 - 106 mg/dL    BUN 21 7 - 25 mg/dl    Creatinine 1.0 0.6 - 1.3 mg/dl    Hematocrit 48 (H) 38 - 45 %    Hemoglobin 16.3 13.0 - 17.2 gm/dl    Calcium, Ionized 4.70 4.40 - 5.40 mg/dL

## 2022-12-10 NOTE — ED Notes (Signed)
Discharge papers given and reviewed with patient.    Questions encouraged    pt had no questions for this nurse    ambulatory out of department.      Doylene Canning, LPN  [phone] [id number]

## 2022-12-10 NOTE — ED Triage Notes (Signed)
Pt arrives ambulatory with c/o HA x 1.5 weeks.  Pt states recently exposed to covid.

## 2022-12-11 LAB — COVID-19, FLU A/B, AND RSV COMBO
Influenza A H1 (Seasonal) PCR: NEGATIVE
Influenza virus B RNA: NEGATIVE
RSV A/B PCR: NEGATIVE
SARS-CoV-2: NEGATIVE

## 2022-12-11 LAB — POC CHEM 8
BUN: 21 mg/dl (ref 7–25)
Calcium, Ionized: 4.7 mg/dL (ref 4.40–5.40)
Chloride: 104 mEq/L (ref 98–107)
Creatinine: 1 mg/dl (ref 0.6–1.3)
Glucose: 81 mg/dL (ref 74–106)
Hematocrit: 48 % — ABNORMAL HIGH (ref 38–45)
Hemoglobin: 16.3 gm/dl (ref 13.0–17.2)
Potassium: 3.9 mEq/L (ref 3.5–4.9)
Sodium: 139 mEq/L (ref 136–145)
Total CO2: 24 mmol/L (ref 21–32)

## 2022-12-11 MED ORDER — DIPHENHYDRAMINE HCL 50 MG/ML IJ SOLN
50 | INTRAMUSCULAR | Status: AC
Start: 2022-12-11 — End: 2022-12-10
  Administered 2022-12-11: 01:00:00 25 mg via INTRAVENOUS

## 2022-12-11 MED ORDER — ACETAMINOPHEN 10 MG/ML IV SOLN
10 | Freq: Once | INTRAVENOUS | Status: AC
Start: 2022-12-11 — End: 2022-12-10
  Administered 2022-12-11: 01:00:00 1000 mg via INTRAVENOUS

## 2022-12-11 MED ORDER — METOCLOPRAMIDE HCL 5 MG/ML IJ SOLN
5 | INTRAMUSCULAR | Status: AC
Start: 2022-12-11 — End: 2022-12-10
  Administered 2022-12-11: 01:00:00 10 mg via INTRAVENOUS

## 2022-12-11 MED FILL — DIPHENHYDRAMINE HCL 50 MG/ML IJ SOLN: 50 MG/ML | INTRAMUSCULAR | Qty: 1

## 2022-12-11 MED FILL — ACETAMINOPHEN 10 MG/ML IV SOLN: 10 MG/ML | INTRAVENOUS | Qty: 100

## 2022-12-11 MED FILL — METOCLOPRAMIDE HCL 5 MG/ML IJ SOLN: 5 MG/ML | INTRAMUSCULAR | Qty: 2

## 2022-12-31 NOTE — ED Notes (Signed)
Formatting of this note might be different from the original.  Pain assessment on discharge was 0.  Condition stable.  Patient discharged to home.  Patient education was completed:  yes  Education taught to:  patient  Teaching method used was discussion and handout.  Understanding of teaching was good.  Patient was discharged ambulatory.  Discharged with friend.  Valuables were given to: patient.    Electronically signed by Robbie Louis, RN at 12/31/2022  4:15 PM EST

## 2022-12-31 NOTE — ED Triage Notes (Signed)
Formatting of this note might be different from the original.  Presents to the ER today for evaluation of vaginal bleeding and left unilateral tenderness. Patient unsure of current gestational age. Patient states her "app" on her phone approximates 6 weeks.  Denies fever, dysuria.  Electronically signed by Elfredia Nevins, RN at 12/31/2022  1:11 PM EST

## 2022-12-31 NOTE — ED Notes (Signed)
Formatting of this note is different from the original.     12/31/22 1424   Lab Draw   Lab Draw Time 1421   Lab Sent Time 1424   Lab Draw Site Left;Antecubital   Lab Draw Device Angiocatheter   Device Gauge [address] Drawn with IV Start Yes   Lab Tubes Pink;Lavender;Blue;Green;SST       Electronically signed by Rulon Abide, LPN at [phone]  624THL PM EST

## 2022-12-31 NOTE — ED Notes (Signed)
Formatting of this note is different from the original.     12/31/22 1334   GYN/Reproductive (Adult)   Additional Documentation Vaginal Bleeding (Group)   Vaginal Bleeding   Vaginal Bleeding present   Characteristics (Vaginal Bleeding) with cramping;spotting   Vaginal Bleeding Onset 12/30/2022     Pt ambulatory w/ c/o bilateral lower abdominal pain w/ vaginal spotting since yesterday.     Pt had + home pregnancy test.     [id number]    Past Medical History:   Diagnosis Date    Headache disorder     Multiple gestation      Electronically signed by Orpha Bur, RN at 12/31/2022  1:35 PM EST

## 2023-01-31 NOTE — ED Provider Notes (Signed)
01/31/2023    CC:   Chief Complaint   Patient presents with   . [name] Allen; PREGNANT         HPI:  [name] Allen is a 27 y.o. female G3P2 presenting to the ED with c/o [name] bleeding and cramping starting approximately 20 minutes ago.  Patient was seen at outside ED on 12/31/2022 for similar symptoms at that time she had an ultrasound showing single IUP at 4 weeks 5 days without a fetal pole detected in several ovarian cysts.  Patient was advised to follow-up with OB on 4/11.  She states after intercourse this morning with her partner she had mild cramping and has noticed a small amount of bright red bleeding upon wiping but has not required a pad.  Patient denies any dysuria [name] irritation or abnormal discharge.  She denies any fevers or flulike symptoms.    ROS  10 Systems reviewed and negative except as noted in the HPI      Past Medical and Surgical History    Past Medical History:   Diagnosis Date   . Headache disorder    . Multiple gestation      No past surgical history on file.   If the past medical or past surgical history sections read "No history on file" it reflects documentation that the histories were verbally investigated but the patient denies any past medical or surgical history.     Social   Social History     Socioeconomic History   . Marital status: Single     Spouse name: Not on file   . Number of children: Not on file   . Years of education: Not on file   . Highest education level: Not on file   Occupational History   . Not on file   Tobacco Use   . Smoking status: Never   . Smokeless tobacco: Never   Substance and Sexual Activity   . Alcohol use: No   . Drug use: No   . Sexual activity: Not on file   Other Topics Concern   . Not on file   Social History Narrative   . Not on file     Social Determinants of Health     Financial Resource Strain: Not on file   Food Insecurity: Not on file   Transportation Needs: Not on file   Physical Activity: Not on file   Stress: Not on file   Social  Connections: Not on file   Intimate Partner Violence: Not on file   Housing Stability: Not on file       Medications  No current facility-administered medications on file prior to encounter.     Current Outpatient Medications on File Prior to Encounter   Medication Sig Dispense Refill   . albuterol (PROVENTIL, VENTOLIN, PROAIR) 90 mcg/actuation INH HFAA inhaler Take 1-2 Puffs inhaled by mouth Every 4 Hours As Needed. 6.7 g 0   . codeine-guaiFENesin (ROBITUSSIN AC) 10-100 mg/5 mL PO LIQD Take 5 mL by Mouth Every 4 Hours As Needed for Cough (PRN COUGH). NO DRIVING WHILE TAKING THIS MEDICATION. 120 mL 0   . ibuprofen (MOTRIN) 800 mg PO TABS Take 1 Tab by Mouth Every 8 Hours As Needed for Pain (mild pain ). 90 Tab 1   . polyethylene glycol (MIRALAX) 17 gram PO PwPk Mix 1 packet (17g) into 4-8 ounces of beverage and drink once daily. Results in 2-4 days, Max treatment length of 2 weeks. 12 Packet 0   .  PRENATAL VIT W-CA,FE,FA,<1 MG, (PRENATAL #2 PO) Take  by Mouth.         Allergies  No Known Allergies    PHYSICAL EXAM:    No data found.            General: comfortable, NAD  HEENT: PERRLA, EOM intact  Pulmonary: CTAB, no wheezes, crackles  CV: RRR, no gross m/r/g  Abdomen: non-ttp, no rigidity or guarding noted, no obvious distention, no masses appreciated  Ext:  no gross lower ext edema  Neuro: CN II-XII intact, no focal neuro deficits  Psych: appropriate and cooperative with exam    LABS:  Results for orders placed or performed during the hospital encounter of 01/31/23   URINALYSIS POC (LAB)   Result Value Ref Range    Urine pH 7.0 5.0 - 8.0 pH    Urine Protein Screen Negative Negative mg/dL    Urine Glucose Negative Negative mg/dL    Urine Ketones Negative Negative mg/dL    Urine Occult Blood Moderate (A) Negative    Urine Specific Gravity 1.020 1.003 - 1.030    Urine Nitrite Negative Negative    Urine Leukocyte Esterase Negative Negative    Urine Bilirubin Negative Negative    Urine Urobilinogen 1.0 0.2 - 1.0 mg/dL    PREGNANCY URINE POC (LAB)   Result Value Ref Range    PREGNANCY URINE Positive (A) Negative   BASIC METABOLIC PANEL   Result Value Ref Range    Potassium 4.0 3.5 - 5.5 mmol/L    Sodium 135 133 - 145 mmol/L    Chloride 106 98 - 110 mmol/L    Glucose 91 70 - 99 mg/dL    Calcium 9.1 8.4 - 16.1 mg/dL    BUN 14 6 - 22 mg/dL    Creatinine 0.9 0.5 - 1.2 mg/dL    CO2 21 20 - 32 mmol/L    eGFR >60.0 >60.0 mL/min/1.73 sq.m.    Anion Gap 8.0 3.0 - 15.0 mmol/L   BETA HCG BLOOD (QUANTITATIVE)   Result Value Ref Range    Beta HCG 27,158 mIU/mL   CBC WITH DIFFERENTIAL AUTO   Result Value Ref Range    WBC 6.1 4.0 - 11.0 K/uL    RBC 4.62 3.80 - 5.20 M/uL    HGB 13.0 11.7 - 15.5 g/dL    HCT 09.6 04.5 - 40.9 %    MCV 87 80 - 99 fL    MCH 28 26 - 34 pg    MCHC 32 31 - 36 g/dL    RDW 81.1 91.4 - 78.2 %    Platelet 215 140 - 440 K/uL    MPV 10.7 9.0 - 13.0 fL    Segmented Neutrophils (Auto) 50 40 - 75 %    Lymphocytes (Auto) 32 20 - 45 %    Monocytes (Auto) 10 3 - 12 %    Eosinophils (Auto) 8 (H) 0 - 6 %    Basophils (Auto) 1 0 - 2 %    Absolute Neutrophils (Auto) 3.0 1.8 - 7.7 K/uL    Absolute Lymphocytes (Auto) 2.0 1.0 - 4.8 K/uL    Absolute Monocytes (Auto) 0.6 0.1 - 1.0 K/uL    Absolute Eosinophils (Auto) 0.5 0.0 - 0.5 K/uL    Absolute Basophils (Auto) 0.0 0.0 - 0.2 K/uL       US OB COMPLETE < 14 WKS W/ TV   Final Result      Single intrauterine gestation sac with yolk sac identified but  no fetal pole. Mean sac diameter 1.8 cm correlates with 6 weeks and 4 day gestation age. This is discordant with provided LMP gestation age. Follow-up ultrasound recommended in 1-2 weeks to ensure normal pregnancy development.      Likely small subchorionic hemorrhage.      Signed By: Migdalia Dk, MD on 01/31/2023 2:19 PM             Documentation Review:   Initial ED Provider Note, Nursing notes  The nursing notes for this patient's current visit have been reviewed, and any pertinent information has been incorporated into this note, particularly  the vital signs, PMSFSHx, and initial presenting complaint.      MEDICAL DECISION MAKING  [name] Allen is a 27 y.o. female G3P2 presenting to the ED with c/o [name] bleeding and cramping starting approximately 20 minutes ago.   She is agreeable this plan all questions answered.On exam, VSS pt well appearing Imp: Threatened miscarriage    Studies Reviewed and/or Interpreted by Me:   Blood type O+  Beta-hCG 27,158, previous value from 12/31/2022 1224  Ultrasound showing single IUP with yolk sac but again no identified fetal pole correlating with 6 weeks 4 days.  Small subchorionic hemorrhage also noted  UA without evidence of infection    DISPOSITION NOTES:   Discussed results with patient at bedside, discussed importance of expedited OB care for repeat ultrasound and blood work as needed.  Patient was given instructions for pelvic rest discussed return to ED precautions.  Return to ED precautions.  Meds given in ED:  Medications - No data to display    FINAL DIAGNOSIS:  (O20.8) Subchorionic hemorrhage of placenta in first trimester      Discharge Rx:    Discharge Medication List as of 01/31/2023  2:38 PM

## 2023-02-10 ENCOUNTER — Observation Stay
Admit: 2023-02-10 | Discharge: 2023-02-11 | Disposition: A | Discharge status: Home / Self Care | Payer: PRIVATE HEALTH INSURANCE | Admitting: Specialist

## 2023-02-10 ENCOUNTER — Inpatient Hospital Stay: Admit: 2023-02-10 | Payer: PRIVATE HEALTH INSURANCE | Primary: Pediatrics

## 2023-02-10 DIAGNOSIS — O039 Complete or unspecified spontaneous abortion without complication: Secondary | ICD-10-CM

## 2023-02-10 DIAGNOSIS — O034 Incomplete spontaneous abortion without complication: Secondary | ICD-10-CM

## 2023-02-10 LAB — CBC
Hematocrit: 22.5 % — ABNORMAL LOW (ref 35.0–47.0)
Hematocrit: 26 % — ABNORMAL LOW (ref 35.0–47.0)
Hemoglobin: 7.2 gm/dl — ABNORMAL LOW (ref 11.0–16.0)
Hemoglobin: 8.6 gm/dl — ABNORMAL LOW (ref 11.0–16.0)
MCH: 28.2 pg (ref 25.4–34.6)
MCH: 30.5 pg (ref 25.4–34.6)
MCHC: 32 gm/dl (ref 30.0–36.0)
MCHC: 33.1 gm/dl (ref 30.0–36.0)
MCV: 88.2 fL (ref 80.0–98.0)
MCV: 92.2 fL (ref 80.0–98.0)
MPV: 11.4 fL — ABNORMAL HIGH (ref 6.0–10.0)
MPV: 11.1 fL — ABNORMAL HIGH (ref 6.0–10.0)
Platelets: 193 10*3/uL (ref 140–450)
Platelets: 127 10*3/uL — ABNORMAL LOW (ref 140–450)
RBC: 2.55 M/uL — ABNORMAL LOW (ref 3.60–5.20)
RBC: 2.82 M/uL — ABNORMAL LOW (ref 3.60–5.20)
RDW: 44.5 (ref 36.4–46.3)
RDW: 46.4 — ABNORMAL HIGH (ref 36.4–46.3)
WBC: 12.9 10*3/uL — ABNORMAL HIGH (ref 4.0–11.0)
WBC: 11 10*3/uL (ref 4.0–11.0)

## 2023-02-10 LAB — BASIC METABOLIC PANEL
Anion Gap: 9 mmol/L (ref 5–15)
BUN: 19 mg/dl (ref 9–23)
CO2: 22 mEq/L (ref 20–31)
Calcium: 8.5 mg/dl — ABNORMAL LOW (ref 8.7–10.4)
Chloride: 105 mEq/L (ref 98–107)
Creatinine: 1.04 mg/dl — ABNORMAL HIGH (ref 0.55–1.02)
GFR African American: >60
GFR Non-African American: >60
Glucose: 165 mg/dl — ABNORMAL HIGH (ref 74–106)
Potassium: 3.4 mEq/L — ABNORMAL LOW (ref 3.5–5.1)
Sodium: 136 mEq/L (ref 136–145)

## 2023-02-10 LAB — HCG, QUANTITATIVE, PREGNANCY: HCG: 3606 m[IU]/mL — ABNORMAL HIGH (ref 0–10)

## 2023-02-10 LAB — ANTIBODY SCREEN: Antibody Screen: NEGATIVE

## 2023-02-10 LAB — ABO/RH: ABO/Rh: O POS

## 2023-02-10 MED ORDER — SODIUM CHLORIDE 0.9 % IV BOLUS
0.9 | Freq: Once | INTRAVENOUS | Status: AC
Start: 2023-02-10 — End: 2023-02-10
  Administered 2023-02-10: 10:00:00 1000 mL via INTRAVENOUS

## 2023-02-10 MED ORDER — HYDROMORPHONE HCL 1 MG/ML IJ SOLN
1 | INTRAMUSCULAR | Status: DC | PRN
Start: 2023-02-10 — End: 2023-02-10

## 2023-02-10 MED ORDER — SODIUM CHLORIDE 0.9 % IV SOLN
0.9 | INTRAVENOUS | Status: AC | PRN
Start: 2023-02-10 — End: 2023-02-10
  Administered 2023-02-10 (×2): via INTRAVENOUS

## 2023-02-10 MED ORDER — CALCIUM CHLORIDE 10 % IV SOLN
10 % | INTRAVENOUS | Status: DC | PRN
  Administered 2023-02-10: 13:00:00 .25 via INTRAVENOUS

## 2023-02-10 MED ORDER — FENTANYL CITRATE (PF) 100 MCG/2ML IJ SOLN
100 | INTRAMUSCULAR | Status: DC | PRN
Start: 2023-02-10 — End: 2023-02-10

## 2023-02-10 MED ORDER — EPHEDRINE SULFATE (PRESSORS) 50 MG/ML IV SOLN: 50 MG/ML | INTRAVENOUS | Status: AC

## 2023-02-10 MED ORDER — PHENYLEPHRINE HCL 10 MG/ML SOLN (MIXTURES ONLY)
10 MG/ML | Status: DC | PRN
  Administered 2023-02-10: 12:00:00 100 via INTRAVENOUS

## 2023-02-10 MED ORDER — NORMAL SALINE FLUSH 0.9 % IV SOLN
0.9 | INTRAVENOUS | Status: DC | PRN
Start: 2023-02-10 — End: 2023-02-11

## 2023-02-10 MED ORDER — CEFAZOLIN SODIUM 1 G IJ SOLR
1 g | INTRAMUSCULAR | Status: DC | PRN
  Administered 2023-02-10: 12:00:00 2 via INTRAVENOUS

## 2023-02-10 MED ORDER — DEXAMETHASONE SODIUM PHOSPHATE 4 MG/ML IJ SOLN
4 MG/ML | INTRAMUSCULAR | Status: DC | PRN
  Administered 2023-02-10: 12:00:00 8 via INTRAVENOUS

## 2023-02-10 MED ORDER — PHENYLEPHRINE HCL (PRESSORS) 10 MG/ML IV SOLN: 10 MG/ML | INTRAVENOUS | Status: AC

## 2023-02-10 MED ORDER — MISOPROSTOL 200 MCG PO TABS: 200 MCG | ORAL | Status: AC

## 2023-02-10 MED ORDER — MIDAZOLAM HCL 2 MG/2ML IJ SOLN
2 | INTRAMUSCULAR | Status: DC | PRN
Start: 2023-02-10 — End: 2023-02-10
  Administered 2023-02-10 (×2): 1 via INTRAVENOUS

## 2023-02-10 MED ORDER — SODIUM CHLORIDE 0.9 % IV SOLN
0.9 % | INTRAVENOUS | Status: DC | PRN
Start: 2023-02-10 — End: 2023-02-11

## 2023-02-10 MED ORDER — LACTATED RINGERS IV SOLN
INTRAVENOUS | Status: DC
Start: 2023-02-10 — End: 2023-02-10

## 2023-02-10 MED ORDER — LIDOCAINE HCL (PF) 1 % IJ SOLN
1 | Freq: Once | INTRAMUSCULAR | Status: DC | PRN
Start: 2023-02-10 — End: 2023-02-10

## 2023-02-10 MED ORDER — DEXAMETHASONE SODIUM PHOSPHATE 20 MG/5ML IJ SOLN: 20 MG/5ML | INTRAMUSCULAR | Status: AC

## 2023-02-10 MED ORDER — ONDANSETRON HCL 4 MG/2ML IJ SOLN
4 | Freq: Once | INTRAMUSCULAR | Status: DC | PRN
Start: 2023-02-10 — End: 2023-02-10

## 2023-02-10 MED ORDER — ONDANSETRON 4 MG PO TBDP
4 | Freq: Three times a day (TID) | ORAL | Status: DC | PRN
Start: 2023-02-10 — End: 2023-02-11

## 2023-02-10 MED ORDER — NORMAL SALINE FLUSH 0.9 % IV SOLN
0.9 | Freq: Two times a day (BID) | INTRAVENOUS | Status: DC
Start: 2023-02-10 — End: 2023-02-11
  Administered 2023-02-10 – 2023-02-11 (×3): 10 mL via INTRAVENOUS

## 2023-02-10 MED ORDER — PROPOFOL 200 MG/20ML IV EMUL: 200 MG/20ML | INTRAVENOUS | Status: AC

## 2023-02-10 MED ORDER — HYDRALAZINE HCL 20 MG/ML IJ SOLN
20 | INTRAMUSCULAR | Status: DC | PRN
Start: 2023-02-10 — End: 2023-02-10

## 2023-02-10 MED ORDER — MISOPROSTOL 25 MCG PRE-SPLIT TABLET
25 | ORAL | Status: DC | PRN
Start: 2023-02-10 — End: 2023-02-10
  Administered 2023-02-10: 12:00:00 800 via RECTAL

## 2023-02-10 MED ORDER — SODIUM CHLORIDE (PF) 0.9 % IJ SOLN
0.9 | INTRAMUSCULAR | Status: DC | PRN
Start: 2023-02-10 — End: 2023-02-10

## 2023-02-10 MED ORDER — LIDOCAINE HCL 1 % IJ SOLN
1 % | INTRAMUSCULAR | Status: DC | PRN
  Administered 2023-02-10: 12:00:00 40 via INTRAVENOUS

## 2023-02-10 MED ORDER — ONDANSETRON HCL 4 MG/2ML IJ SOLN
4 | Freq: Four times a day (QID) | INTRAMUSCULAR | Status: DC | PRN
Start: 2023-02-10 — End: 2023-02-11

## 2023-02-10 MED ORDER — ALBUMIN HUMAN 5 % IV SOLN
5 % | INTRAVENOUS | Status: DC | PRN
  Administered 2023-02-10: 12:00:00 500 via INTRAVENOUS

## 2023-02-10 MED ORDER — ONDANSETRON HCL 4 MG/2ML IJ SOLN: 4 MG/2ML | INTRAMUSCULAR | Status: AC

## 2023-02-10 MED ORDER — FENTANYL 0.05 MG/ML SOLN (MIXTURES ONLY)
0.05 mg/mL | Status: DC | PRN
  Administered 2023-02-10: 13:00:00 50 via INTRAVENOUS
  Administered 2023-02-10 (×2): 25 via INTRAVENOUS

## 2023-02-10 MED ORDER — NORMAL SALINE FLUSH 0.9 % IV SOLN
0.9 | INTRAVENOUS | Status: DC | PRN
Start: 2023-02-10 — End: 2023-02-10

## 2023-02-10 MED ORDER — ONDANSETRON HCL 4 MG/2ML IJ SOLN
4 MG/2ML | INTRAMUSCULAR | Status: DC | PRN
  Administered 2023-02-10: 12:00:00 4 via INTRAVENOUS

## 2023-02-10 MED ORDER — MIDAZOLAM HCL 2 MG/2ML IJ SOLN: 2 MG/2ML | INTRAMUSCULAR | Status: AC

## 2023-02-10 MED ORDER — SUCCINYLCHOLINE CHLORIDE 20 MG/ML IJ SOLN
20 MG/ML | INTRAMUSCULAR | Status: DC | PRN
  Administered 2023-02-10: 12:00:00 100 via INTRAVENOUS

## 2023-02-10 MED ORDER — ESMOLOL HCL 100 MG/10ML IV SOLN: 100 MG/10ML | INTRAVENOUS | Status: AC

## 2023-02-10 MED ORDER — NORMAL SALINE FLUSH 0.9 % IV SOLN
0.9 | Freq: Two times a day (BID) | INTRAVENOUS | Status: DC
Start: 2023-02-10 — End: 2023-02-10

## 2023-02-10 MED ORDER — IBUPROFEN 600 MG PO TABS
600 MG | Freq: Three times a day (TID) | ORAL | Status: DC | PRN
Start: 2023-02-10 — End: 2023-02-11
  Administered 2023-02-10 – 2023-02-11 (×4): 600 mg via ORAL

## 2023-02-10 MED ORDER — SODIUM CHLORIDE 0.9 % IV SOLN
0.9 | INTRAVENOUS | Status: DC | PRN
Start: 2023-02-10 — End: 2023-02-10

## 2023-02-10 MED ORDER — PROPOFOL 200 MG/20ML IV EMUL
200 MG/20ML | INTRAVENOUS | Status: DC | PRN
  Administered 2023-02-10: 12:00:00 120 via INTRAVENOUS

## 2023-02-10 MED ORDER — SUCCINYLCHOLINE CHLORIDE 20 MG/ML IJ SOLN: 20 MG/ML | INTRAMUSCULAR | Status: AC

## 2023-02-10 MED ORDER — FENTANYL CITRATE (PF) 100 MCG/2ML IJ SOLN: 100 MCG/2ML | INTRAMUSCULAR | Status: AC

## 2023-02-10 MED ORDER — LIDOCAINE HCL 1 % IJ SOLN: 1 % | INTRAMUSCULAR | Status: AC

## 2023-02-10 MED FILL — ONDANSETRON HCL 4 MG/2ML IJ SOLN: 4 MG/2ML | INTRAMUSCULAR | Qty: 2

## 2023-02-10 MED FILL — FENTANYL CITRATE (PF) 100 MCG/2ML IJ SOLN: 100 MCG/2ML | INTRAMUSCULAR | Qty: 2

## 2023-02-10 MED FILL — SODIUM CHLORIDE FLUSH 0.9 % IV SOLN: 0.9 % | INTRAVENOUS | Qty: 10

## 2023-02-10 MED FILL — LACTATED RINGERS IV SOLN: INTRAVENOUS | Qty: 1000

## 2023-02-10 MED FILL — IBUPROFEN 600 MG PO TABS: 600 MG | ORAL | Qty: 1

## 2023-02-10 MED FILL — MISOPROSTOL 200 MCG PO TABS: 200 MCG | ORAL | Qty: 4

## 2023-02-10 MED FILL — PROPOFOL 200 MG/20ML IV EMUL: 200 MG/20ML | INTRAVENOUS | Qty: 20

## 2023-02-10 MED FILL — BREVIBLOC 100 MG/10ML IV SOLN: 100 MG/10ML | INTRAVENOUS | Qty: 10

## 2023-02-10 MED FILL — PHENYLEPHRINE HCL (PRESSORS) 10 MG/ML IV SOLN: 10 MG/ML | INTRAVENOUS | Qty: 1

## 2023-02-10 MED FILL — DEXAMETHASONE SODIUM PHOSPHATE 20 MG/5ML IJ SOLN: 20 MG/5ML | INTRAMUSCULAR | Qty: 5

## 2023-02-10 MED FILL — LIDOCAINE HCL 1 % IJ SOLN: 1 % | INTRAMUSCULAR | Qty: 20

## 2023-02-10 MED FILL — EPHEDRINE SULFATE (PRESSORS) 50 MG/ML IV SOLN: 50 MG/ML | INTRAVENOUS | Qty: 1

## 2023-02-10 MED FILL — MIDAZOLAM HCL 2 MG/2ML IJ SOLN: 2 MG/ML | INTRAMUSCULAR | Qty: 2

## 2023-02-10 MED FILL — SUCCINYLCHOLINE CHLORIDE 20 MG/ML IJ SOLN: 20 MG/ML | INTRAMUSCULAR | Qty: 10

## 2023-02-10 NOTE — Other (Signed)
Dr Althea GrimmerAllene Dillon notified of cbc results

## 2023-02-10 NOTE — Other (Signed)
Lab notified of stat cbc

## 2023-02-10 NOTE — Other (Signed)
TRANSFER - OUT REPORT:    Verbal report given to T, Rn on [name] Allen  being transferred to 3210 for routine post-op       Report consisted of patient's Situation, Background, Assessment and   Recommendations(SBAR).     Information from the following report(s) Nurse Handoff Report was reviewed with the receiving nurse.           Lines:   Peripheral IV 02/10/23 Right Antecubital (Active)   Site Assessment Clean, dry & intact 02/10/23 0908   Line Status Blood return noted;Flushed;Infusing 02/10/23 0908   Phlebitis Assessment No symptoms 02/10/23 0908   Infiltration Assessment 0 02/10/23 0908   Dressing Status Clean, dry & intact 02/10/23 0908   Dressing Type Transparent 02/10/23 0908       Peripheral IV 02/10/23 Left Antecubital (Active)   Site Assessment Clean, dry & intact 02/10/23 0908   Line Status Blood return noted;Flushed;Infusing 02/10/23 0908   Phlebitis Assessment No symptoms 02/10/23 0908   Infiltration Assessment 0 02/10/23 0908   Dressing Status Clean, dry & intact 02/10/23 0908   Dressing Type Transparent 02/10/23 0908   Dressing Intervention New 02/10/23 0535        Opportunity for questions and clarification was provided.      Patient transported with:  [employer]

## 2023-02-10 NOTE — ED Notes (Signed)
Report given to OR RN.      Janan Halter, RN  02/10/23 [phone]

## 2023-02-10 NOTE — Anesthesia Post-Procedure Evaluation (Signed)
Department of Anesthesiology  Postprocedure Note    Patient: [name] Allen  MRN: [id number]  Birthdate: [birth year]  Date of evaluation: 02/10/2023    Procedure Summary       Date: 02/10/23 Room / Location: [facility] MAIN 09 / [facility] MAIN OR    Anesthesia Start: 0807 Anesthesia Stop: 0900    Procedure: DILATATION AND CURETTAGE SUCTION (Uterus) Diagnosis:       Missed abortion      (Missed abortion [O02.1])    Surgeons: Etta Grandchild, MD Responsible Provider: Alonza Smoker, DO    Anesthesia Type: General ASA Status: 3 - Emergent            Anesthesia Type: General    Aldrete Phase I: Aldrete Score: 10    Aldrete Phase II:      Anesthesia Post Evaluation    Patient location during evaluation: PACU  Patient participation: complete - patient participated  Level of consciousness: awake  Airway patency: patent  Nausea & Vomiting: no nausea and no vomiting  Cardiovascular status: hemodynamically stable  Respiratory status: acceptable  Hydration status: stable  Multimodal analgesia pain management approach  Pain management: satisfactory to patient    No notable events documented.

## 2023-02-10 NOTE — Care Coordination-Inpatient (Signed)
Pharmacy: [facility] on [address]    Pets:  no   Needs Assistance: no  Clothes: In room   Advanced Care Plan: no Ordered: no  Dialysis: no  TCC Referral:  no  DME:  no  Dispo Home with family support. Mom for transport.   02/10/23 1244   Service Assessment   Patient Orientation Alert and Oriented   Cognition Alert   History Provided By Patient   Primary Caregiver Family   Support Systems Parent   PCP Verified by CM Yes   Prior Functional Level Independent in ADLs/IADLs   Current Functional Level Independent in ADLs/IADLs   Can patient return to prior living arrangement Yes   Ability to make needs known: Good   Family able to assist with home care needs: Yes   Social/Functional History   Lives With Parent   Type of Home House   Home Layout One level   Home Access Stairs to enter with rails   Entrance Stairs - Number of Steps 3   Bathroom Shower/Tub Tub/Shower unit   Information systems manager   ADL Assistance Independent   Occupational hygienist No   Discharge Medical sales representative;Family Members   Potential Assistance Purchasing Medications No   Patient expects to be discharged to: House   One/Two Story Residence One story   History of falls? 0   Services At/After Discharge   Confirm Follow Up Transport Family

## 2023-02-10 NOTE — ED Notes (Signed)
Pt educated about being NPO due to possible surgery. Pt and pts family members verbalized understanding.      Janan Halter, RN  02/10/23 [phone]

## 2023-02-10 NOTE — ED Provider Notes (Signed)
[facility]  Emergency Department Treatment Report        Patient: [name] Allen Age: 27 y.o. Sex: female    Date of Birth: 1996-02-20 Admit Date: 02/10/2023 PCP: Gershon Cull, MD   MRN: [id number]  CSN: [id number]     Room: ER23/ER23 Time Dictated: 6:09 AM        Dragon medical dictation software was used for portions of this report.  Unintended transcription errors may occur.      Chief Complaint   Chief Complaint   Patient presents with    Bleeding During Pregnancy    Loss of Consciousness       History of Present Illness   This is a 27 y.o. female G3 P2 who was seen at her obstetric group, [facility] on Tuesday of this week and diagnosed with miscarriage.  She was given medications to take but opted to not take them and passed the miscarriage naturally.  She notes that she started having bleeding yesterday, and blood through "a lot of pads", and in the last 24 hours has had to move onto adult diapers for the amount of bleeding that she has had.  Today she started feeling weak lightheaded and dizzy, and passed out in her mom's kitchen.  She takes no blood thinners, has no pain, has had no fever.    Review of Systems   Review of Systems   Constitutional:  Negative for chills, fever and unexpected weight change.   HENT:  Negative for congestion, ear pain, rhinorrhea, sinus pressure and sore throat.    Eyes:  Negative for discharge and redness.   Respiratory:  Negative for chest tightness, shortness of breath and wheezing.    Cardiovascular:  Negative for chest pain.   Gastrointestinal:  Negative for abdominal pain, nausea and vomiting.   Genitourinary:  Positive for vaginal bleeding. Negative for dysuria, frequency and urgency.   Skin:  Negative for rash.   Neurological:  Negative for weakness and headaches.         Past Medical/Surgical History     Past Medical History:   Diagnosis Date    Asthma      No past surgical history on file.    Social History     Social History     Socioeconomic  History    Marital status: Single     Spouse name: Not on file    Number of children: Not on file    Years of education: Not on file    Highest education level: Not on file   Occupational History    Not on file   Tobacco Use    Smoking status: Every Day     Current packs/day: 0.25     Types: Cigarettes    Smokeless tobacco: Never   Substance and Sexual Activity    Alcohol use: Yes    Drug use: Yes     Types: Marijuana [name]    Sexual activity: Not on file   Other Topics Concern    Not on file   Social History Narrative    Not on file     Social Determinants of Health     Financial Resource Strain: Not on file   Food Insecurity: Not on file   Transportation Needs: Not on file   Physical Activity: Not on file   Stress: Not on file   Social Connections: Not on file   Intimate Partner Violence: Not on file   Housing Stability: Not on  file       Family History     Family History   Problem Relation Age of Onset    Diabetes Paternal Grandfather     Diabetes Maternal Grandmother        Current Medications     Current Facility-Administered Medications   Medication Dose Route Frequency Provider Last Rate Last Admin    0.9 % sodium chloride infusion   IntraVENous PRN Rabia Argote, Sherie Don, MD         No current outpatient medications on file.       Allergies   No Known Allergies    Physical Exam   Patient Vitals for the past 24 hrs:   Temp Pulse Resp BP SpO2   02/10/23 0556 -- 98 23 123/81 100 %   02/10/23 0546 -- 100 26 101/80 100 %   02/10/23 0541 -- (!) 124 24 (!) 83/72 100 %   02/10/23 0539 -- (!) 106 19 116/67 100 %   02/10/23 0537 -- 99 -- 113/66 --   02/10/23 0513 97.3 F (36.3 C) (!) 125 20 112/65 100 %     Physical Exam  Constitutional:       Appearance: Normal appearance.   HENT:      Head: Normocephalic and atraumatic.      Nose: No rhinorrhea.      Mouth/Throat:      Mouth: Mucous membranes are moist.   Eyes:      Conjunctiva/sclera: Conjunctivae normal.   Cardiovascular:      Rate and Rhythm: Normal rate and  regular rhythm.      Pulses: Normal pulses.      Heart sounds: Normal heart sounds.   Pulmonary:      Effort: Pulmonary effort is normal.      Breath sounds: Normal breath sounds.   Abdominal:      General: Abdomen is flat.      Palpations: Abdomen is soft.      Tenderness: There is no abdominal tenderness.   Genitourinary:     Comments: Pelvic exam performed with nursing at the bedside r reveals normal external female genitalia, speculum semination reveals a copious amount of clot in the vaginal vault, I had to remove large clots x 4, and there is still persistent bleeding with what appears to be tissue in the cervical os and vaginal vault.  I was unable to remove this with forceps.  Musculoskeletal:         General: No swelling or tenderness.   Skin:     General: Skin is warm and dry.   Neurological:      General: No focal deficit present.      Mental Status: She is alert.   Psychiatric:         Mood and Affect: Mood normal.              Impression and Management Plan   27 year old female who was diagnosed with miscarriage on Tuesday with persistent bleeding and hemorrhage today here in the ER with syncope and collapse at her mom's house.  Tachycardic in the waiting room with a heart rate of 126, but in the room heart rate is down to 102.  Will establish IV x 2, CBC, BMP, type and screen, GYN consultation.  Differential diagnoses miscarriage, acute blood loss anemia, syncope and collapse, line depletion, acute kidney injury,.  Retained products    Diagnostic Studies   Lab:     Results for orders placed or performed  during the hospital encounter of 02/10/23   Basic Metabolic Panel   Result Value Ref Range    Potassium 3.4 (L) 3.5 - 5.1 mEq/L    Chloride 105 98 - 107 mEq/L    Sodium 136 136 - 145 mEq/L    CO2 22 20 - 31 mEq/L    Glucose 165 (H) 74 - 106 mg/dl    BUN 19 9 - 23 mg/dl    Creatinine 1.61 (H) 0.55 - 1.02 mg/dl    GFR African American >60.0      GFR Non-African American >60      Calcium 8.5 (L) 8.7 - 10.4  mg/dl    Anion Gap 9 5 - 15 mmol/L   CBC   Result Value Ref Range    WBC 12.9 (H) 4.0 - 11.0 1000/mm3    RBC 2.55 (L) 3.60 - 5.20 M/uL    Hemoglobin 7.2 (L) 11.0 - 16.0 gm/dl    Hematocrit 09.6 (L) 35.0 - 47.0 %    MCV 88.2 80.0 - 98.0 fL    MCH 28.2 25.4 - 34.6 pg    MCHC 32.0 30.0 - 36.0 gm/dl    Platelets 045 409 - 450 1000/mm3    MPV 11.4 (H) 6.0 - 10.0 fL    RDW 44.5 36.4 - 46.3     ABO/RH   Result Value Ref Range    ABO/Rh O Rh Positive         Imaging:    No results found.      Cardiac Monitor Interpretation: Ordered for acute blood loss anemia shows a borderline tachycardia, sinus rhythm      Other studies:  My interpretation of other studies is that they show, among other things, H/H low at 7.2/22.5, white count 12.9.    ED Course           INDEPENDENT HISTORIAN:  History and/or plan development assisted by: Mother      Threat to body function without evaluation and management: GYN, hematologic      Comorbidities impacting Evaluation and Management: First trimester pregnancy    Critical care time excluding procedures, but including direct patient care, reviewing medical records, evaluating results of diagnostic testing, discussions with family members, and consulting with physicians: 30 minutes    Medications   0.9 % sodium chloride infusion (has no administration in time range)   sodium chloride 0.9 % bolus 1,000 mL (1,000 mLs IntraVENous New Bag 02/10/23 0534)       NARRATIVE:  Patient with copious amount of clot, borderline blood pressures that have transient dips into the hypertensive range.  Responded well to IV fluids.  Type and screen 14 expected blood cells will begin transfusion here in the ED.  Discussed with OB hospitalist, patient will go to OR for D&C.  Dr. Gillis Santa Rosado-Torres at the bedside.  [employer] guarded.   Will keep n.p.o., IVF.      Final Diagnosis       ICD-10-CM    1. Miscarriage  O03.9       2. Retained products of conception after miscarriage  O03.4       3. Acute blood  loss anemia  D62           Disposition   To the OR      Candis Shine, MD [facility]  February 10, 2023    My signature above authenticates this document and my orders, the final    diagnosis (es), discharge prescription (s), and  instructions in the Epic    record.  If you have any questions please contact [phone].     Nursing notes have been reviewed by the physician/ advanced practice    Clinician.                             Wynelle Bourgeois, MD  02/10/23 [phone]

## 2023-02-10 NOTE — Plan of Care (Signed)
Problem: Pain  Goal: Verbalizes/displays adequate comfort level or baseline comfort level  02/10/2023 2253 by Larwance Rote, RN  Outcome: Progressing  02/10/2023 1529 by Margaree Mackintosh, RN  Outcome: Progressing     Problem: ABCDS Injury Assessment  Goal: Absence of physical injury  02/10/2023 2253 by Larwance Rote, RN  Outcome: Progressing  02/10/2023 1529 by Margaree Mackintosh, RN  Outcome: Progressing

## 2023-02-10 NOTE — ED Triage Notes (Signed)
Provided a wheelchair in triage due to weakness    Chief complaint non viable pregnancy with vaginal bleeding.  Followed by [facility] in [place]. Last seen in office on 02/04/2023.  Per patient preference she opted to go through miscarriage without medication or intervention. LMP 11/28/2022 G3 P2    Tonight patient started having heavy vaginal bleeding and passing clots.     Felt dizzy, weak and fainted in her mother's kitchen at 0500 this morning while walking to bathroom .    Saline lock and labs ordered.

## 2023-02-10 NOTE — H&P (Signed)
Gynecology History and Physical    Name: [name] Allen MRN: [id number] SSN: [id number]    Date of Birth: 01-18-1996  Age: 27 y.o.  Sex: female       Subjective:      Chief complaint: Incomplete abortion    [name] Allen is a 27 y.o. African American female  G3P2 LMP 11/28/22 that was diagnosed with a missed abortion last week, she was offered cytotec but  did not take them. Patient states that she started bleeding yesterday and decided to use some depends but this morning she almost passed out.    OB History    No obstetric history on file.       Past Medical History:   Diagnosis Date    Asthma      No past surgical history on file.  Social History     Occupational History    Not on file   Tobacco Use    Smoking status: Every Day     Current packs/day: 0.25     Types: Cigarettes    Smokeless tobacco: Never   Substance and Sexual Activity    Alcohol use: Yes    Drug use: Yes     Types: Marijuana [name]    Sexual activity: Not on file     Family History   Problem Relation Age of Onset    Diabetes Paternal Grandfather     Diabetes Maternal Grandmother         No Known Allergies  Prior to Admission medications    Not on File        Review of Systems:  Pertinent items are noted in the History of Present Illness.     Objective:     Vitals:    02/10/23 0539 02/10/23 0541 02/10/23 0546 02/10/23 0556   BP: 116/67 (!) 83/72 101/80 123/81   Pulse: (!) 106 (!) 124 100 98   Resp: 19 24 26 23    Temp:       SpO2: 100% 100% 100% 100%   Weight:       Height:           Physical Exam:  Physical Exam  Genitourinary:      Genitourinary Comments: About clot in bed pan. Per ER physician POC in the cervical os unable to remove   Cardiovascular:      Rate and Rhythm: Normal rate.      Pulses: Normal pulses.   Pulmonary:      Effort: Pulmonary effort is normal.   Abdominal:      Tenderness: There is no abdominal tenderness.   Neurological:      Mental Status: She is alert.           Latest Reference Range & Units 02/10/23 05:15   Sodium  136 - 145 mEq/L 136   Potassium 3.5 - 5.1 mEq/L 3.4 (L)   Chloride 98 - 107 mEq/L 105   CO2 20 - 31 mEq/L 22   BUN,BUNPL 9 - 23 mg/dl 19   Creatinine 4.19 - 1.02 mg/dl 3.79 (H)   Anion Gap 5 - 15 mmol/L 9   GFR Non-African American -   >60   GFR African American -   >60.0   Glucose, Random 74 - 106 mg/dl 024 (H)   CALCIUM, SERUM, 500694 8.7 - 10.4 mg/dl 8.5 (L)   WBC 4.0 - 09.7 1000/mm3 12.9 (H)   RBC 3.60 - 5.20 M/uL 2.55 (L)   Hemoglobin Quant 11.0 - 16.0 gm/dl  7.2 (L)   Hematocrit 35.0 - 47.0 % 22.5 (L)   MCV 80.0 - 98.0 fL 88.2   MCH 25.4 - 34.6 pg 28.2   MCHC 30.0 - 36.0 gm/dl 62.1   MPV 6.0 - 30.8 fL 11.4 (H)   RDW 36.4 - 46.3   44.5   Platelet Count 140 - 450 1000/mm3 193   (L): Data is abnormally low  (H): Data is abnormally high    Assessment:   Incomplete abortion  Anemia  hypotensive    Plan:       Discussed the risks of surgery including the risks of bleeding, infection, blood transfusion, scarring of the uterus and/or perforation of the uterus.The patient understands the risks; any and all questions were answered to the patient's satisfaction.  Will start transfusion of one unit  OR desk and anesthesia called, will move to surgery    Signed By:  Etta Grandchild, MD     February 10, 2023

## 2023-02-10 NOTE — Telephone Encounter (Signed)
----------  DocumentID: [id number] (AP)-------------------------------------------              [facility]                       Patient Education Report         Name: [name] Allen, [name] Allen                  Date: 02/10/2023    MRN: [id number]                    Time: 4:02:28 PM         Patient ordered video: 'Patient Safety: Stay Safe While you are in the Hospital'    from [id number] via phone number: [phone] at 4:02:28 PM    Description: This program outlines some of the precautions patients can take to ensure a speedy recovery without extra complications. The video emphasizes the importance of communicating with the healthcare team.

## 2023-02-10 NOTE — Progress Notes (Signed)
200 urine output straight cath per m.d.

## 2023-02-10 NOTE — Anesthesia Pre-Procedure Evaluation (Addendum)
Department of Anesthesiology  Preprocedure Note       Name:  [name] Allen   Age:  27 y.o.  DOB:  03-05-1996                                          MRN:  [id number]         Date:  02/10/2023      Surgeon: Moishe Spice):  Rosado-Torres, Gillis Santa, MD    Procedure: Procedure(s):  DILATATION AND CURETTAGE SUCTION    Medications prior to admission:   Prior to Admission medications    Not on File       Current medications:    Current Facility-Administered Medications   Medication Dose Route Frequency Provider Last Rate Last Admin   . 0.9 % sodium chloride infusion   IntraVENous PRN Romash, Sherie Don, MD         No current outpatient medications on file.       Allergies:  No Known Allergies    Problem List:    Patient Active Problem List   Diagnosis Code   . Miscarriage O03.9       Past Medical History:        Diagnosis Date   . Asthma        Past Surgical History:  No past surgical history on file.    Social History:    Social History     Tobacco Use   . Smoking status: Every Day     Current packs/day: 0.25     Types: Cigarettes   . Smokeless tobacco: Never   Substance Use Topics   . Alcohol use: Yes                                Ready to quit: Not Answered  Counseling given: Not Answered      Vital Signs (Current):   Vitals:    02/10/23 0539 02/10/23 0541 02/10/23 0546 02/10/23 0556   BP: 116/67 (!) 83/72 101/80 123/81   Pulse: (!) 106 (!) 124 100 98   Resp: 19 24 26 23    Temp:       SpO2: 100% 100% 100% 100%   Weight:       Height:                                                  BP Readings from Last 3 Encounters:   02/10/23 123/81   12/10/22 131/80   12/27/21 114/78       NPO Status:                                                                                 BMI:   Wt Readings from Last 3 Encounters:   02/10/23 69.4 kg (153 lb)   12/10/22 68 kg (150 lb)   12/27/21 68 kg (150 lb)  Body mass index is 27.1 kg/m.    CBC:   Lab Results   Component Value Date/Time    WBC 12.9 02/10/2023 05:15 AM    RBC 2.55  02/10/2023 05:15 AM    HGB 7.2 02/10/2023 05:15 AM    HCT 22.5 02/10/2023 05:15 AM    MCV 88.2 02/10/2023 05:15 AM    RDW 44.5 02/10/2023 05:15 AM    PLT 193 02/10/2023 05:15 AM       CMP:   Lab Results   Component Value Date/Time    NA 136 02/10/2023 05:15 AM    K 3.4 02/10/2023 05:15 AM    CL 105 02/10/2023 05:15 AM    CO2 22 02/10/2023 05:15 AM    CO2 28 09/07/2021 03:49 PM    BUN 19 02/10/2023 05:15 AM    CREATININE 1.04 02/10/2023 05:15 AM    GFRAA >60.0 02/10/2023 05:15 AM    LABGLOM >60 02/10/2023 05:15 AM    GLUCOSE 165 02/10/2023 05:15 AM    PROT 8.4 06/03/2019 04:33 PM    CALCIUM 8.5 02/10/2023 05:15 AM    BILITOT 0.3 06/03/2019 04:33 PM    ALKPHOS 71 06/03/2019 04:33 PM    AST 26 06/03/2019 04:33 PM    ALT 33 06/03/2019 04:33 PM       POC Tests: No results for input(s): "POCGLU", "POCNA", "POCK", "POCCL", "POCBUN", "POCHEMO", "POCHCT" in the last 72 hours.    Coags: No results found for: "PROTIME", "INR", "APTT"    HCG (If Applicable):   Lab Results   Component Value Date    PREGTESTUR negative 12/27/2021        ABGs: No results found for: "PHART", "PO2ART", "PCO2ART", "HCO3ART", "BEART", "O2SATART"     Type & Screen (If Applicable):  No results found for: "LABABO", "LABRH"    Drug/Infectious Status (If Applicable):  No results found for: "HIV", "HEPCAB"    COVID-19 Screening (If Applicable):   Lab Results   Component Value Date/Time    COVID19 NEGATIVE 12/10/2022 08:18 PM           Anesthesia Evaluation  Patient summary reviewed and Nursing notes reviewed   no history of anesthetic complications:   Airway: Mallampati: II  TM distance: >3 FB   Neck ROM: full  Mouth opening: > = 3 FB   Dental:          Pulmonary:normal exam    (+)           asthma: current smoker                          ROS comment: THC use   Cardiovascular:  Exercise tolerance: good (>4 METS)          Rhythm: regular  Rate: abnormal           Beta Blocker:  Not on Beta Blocker        PE comment: Tachycardia   Neuro/Psych:   Negative  Neuro/Psych ROS              GI/Hepatic/Renal: Neg GI/Hepatic/Renal ROS            Endo/Other:    (+) blood dyscrasia (Hgb 7.2): anemia:.Marland Kitchen                  ROS comment: Anemia 2/2 missed abortion with significant hemorrhage  Abdominal: normal exam            Vascular: negative vascular ROS.  Other Findings:         Anesthesia Plan      general     ASA 3 - emergent       Induction: intravenous and rapid sequence.    MIPS: Postoperative opioids intended and Prophylactic antiemetics administered.  Anesthetic plan and risks discussed with patient.    Use of blood products discussed with patient whom consented to blood products.    Plan discussed with CRNA.    Attending anesthesiologist reviewed and agrees with Preprocedure content            Jon Kasparek A Neizan Debruhl, DO   02/10/2023

## 2023-02-10 NOTE — Other (Signed)
02/10/23 2585   Family Communication   Contact Person Relationship to Patient Parent   Family/Significant Other Update Called   Delivery Origin Nurse   Message Disposition Family present - message delivered   Family Communication   Family Update Message Leaving PACU

## 2023-02-10 NOTE — Op Note (Signed)
Pre-op diagnosis: Missed Abortion at 7wks  Post-op diagnosis: Same  Procedure: Suction dilation and curettage  Surgeon: Etta Grandchild, MD  Assistant: none  Anesthesia: general anesthesia  EBL: 150cc  Findings: moderate amount of products of conception    After assuring informed consent, patient was taken to the operating room where anesthesia was initiated without difficulty.  She was placed in the dorsal lithotomy position and prepped and draped in the usual sterile fashion.  Her bladder was drained with a red rubber catheter.      The anterior lip of the cervix was grasped with an Allis clamp and the uterus sounded to 7 cm.  The cervix was serially dilated with products of conception on the os that were grasped and a 8 cm curved suction curette was inserted to the fundus and suction was obtained until no further products of conception were obtained.  The suction curette was removed and a sharp curette was inserted and the cavity was curetted until a gritty texture was noted throughout and no further products of conception were noted.  The suction curette was reintroduced and all remaining blood was suctioned out of the cavity.  All instruments were removed from the uterus and the tenaculum was removed from her cervix.  Excellent hemostasis was noted from the tenaculum sites and the speculum was then removed from the patient's vagina.Cytotec placed per rectum    The patient was awaked from anesthesia and taken to the recovery room awake, alert and in stable condition.  Sponge, lap and instrument counts were correct times two.    Etta Grandchild, MD  February 10, 2023

## 2023-02-10 NOTE — ED Notes (Signed)
MD at bedside to perform pelvic exam.      Janan Halter, RN  02/10/23 [phone]

## 2023-02-10 NOTE — OR Nursing (Signed)
Patient transported from ER with personal belongings    Blanket  Cellular phone  Pillow  Earrings (pair)  Necklace with pendant     All removed from patient placed in belongings bag and given to family (boyfriend) in OR waiting area.

## 2023-02-10 NOTE — ED Notes (Signed)
Started IV, got blood work, informed pt that she is NPO, explained the reasoning for lab work, and advised her that she cannot leave the waiting room to go outside with the IV in her arm. Pt is okay with getting blood transfusion if needed, blood band on pt right wrist. Also informed her that we need a urine sample and that we're working on getting her a room in the back.     Pt verbalized that she understood and is the waiting room      Laney Potash  02/10/23 0532

## 2023-02-10 NOTE — ED Notes (Signed)
MD at bedside to consent pt for a blood transfusion.      Janan Halter, RN  02/10/23 [phone]

## 2023-02-10 NOTE — ED Notes (Signed)
Hand off taken from [employer]  Introduced self to [employer]  Pt. Is going to the OR at this time     Satira Sark, RN  02/10/23 1610

## 2023-02-11 LAB — PREPARE RBC (CROSSMATCH)
Blood Type: 5100
Blood Type: 5100
Dispense Status Blood Bank: TRANSFUSED
Dispense Status Blood Bank: TRANSFUSED
Specimen Expiration: 202404182359
Specimen Expiration: 202404182359
Unit Issue Date/Time: 202404150659
Unit Issue Date/Time: 202404150807

## 2023-02-11 LAB — HEMOGLOBIN AND HEMATOCRIT
Hematocrit: 25.6 % — ABNORMAL LOW (ref 35.0–47.0)
Hemoglobin: 8.5 gm/dl — ABNORMAL LOW (ref 11.0–16.0)

## 2023-02-11 MED ORDER — FERROUS SULFATE 325 (65 FE) MG PO TABS
32565 (65 Fe) MG | ORAL_TABLET | Freq: Two times a day (BID) | ORAL | 1 refills | Status: AC
Start: 2023-02-11 — End: ?

## 2023-02-11 MED FILL — IBUPROFEN 600 MG PO TABS: 600 MG | ORAL | Qty: 1

## 2023-02-11 MED FILL — SODIUM CHLORIDE FLUSH 0.9 % IV SOLN: 0.9 % | INTRAVENOUS | Qty: 10

## 2023-02-11 NOTE — Progress Notes (Signed)
IV catheters removed by unit staff. Discharge instructions provided and reviewed with patient.

## 2023-02-11 NOTE — Discharge Instructions (Signed)
DISCHARGE SUMMARY from Nurse            The discharge information has been reviewed with the patient.  The patient verbalized understanding.  Discharge medications reviewed with the patient and appropriate educational materials and side effects teaching were provided.  ___________________________________________________________________________________________________________________________________

## 2023-02-11 NOTE — Progress Notes (Signed)
Patient transported via wheelchair with belongings to garden entrance and assisted into rear passenger seat of vehicle for discharge home with family.

## 2023-02-11 NOTE — Plan of Care (Signed)
Problem: Pain  Goal: Verbalizes/displays adequate comfort level or baseline comfort level  02/11/2023 0932 by Annalee Genta, RN  Outcome: Completed  02/10/2023 2253 by Larwance Rote, RN  Outcome: Progressing

## 2023-02-11 NOTE — Care Coordination-Inpatient (Signed)
Discharge Plan: home with family support  Discharge Date:  02/11/2023     Does the patient have an insurance company assigned CM or appointed guardian:  no   S  Assisted Living Facility: no    Is patient going to snf? no      Home Health Needed:  no      DME needed and ordered for Discharge:  no       TCC Referral: no  Medication Assistance given: no      Change(MDC/SSDI) Referral/outcome: no     Method of Transportation:  family    Scrip is at [employer]

## 2023-02-11 NOTE — Progress Notes (Signed)
Gynecology Progress Note    [name] Allen    Assessment: anemia  POD 1 s/p D&C for incomplete abortion  S/p @ [facility]    Plan: Discharge home today with: Medications: iron supplement Follow up: 1 wk Diet: as tolerated Activity: as tolerated and vaginal rest x 3 wks    She is without significant complaints. Pain controlled on current medication. Voiding without difficulty. Patient is  passing flatus. She is is tolerating her diet.        Vitals:  BP (!) 111/55   Pulse 79   Temp 97.2 F (36.2 C) (Temporal)   Resp 17   Ht 1.6 m (5\' 3" )   Wt 69.4 kg (153 lb)   SpO2 100%   BMI 27.10 kg/m   Temp (24hrs), Avg:98.1 F (36.7 C), Min:97.2 F (36.2 C), Max:98.8 F (37.1 C)      Last 24hr Input/Output:    Intake/Output Summary (Last 24 hours) at 02/11/2023 0853  Last data filed at 02/10/2023 1954  Gross per 24 hour   Intake 1089 ml   Output --   Net 1089 ml          Exam:    General: alert, appears stated age, and cooperative  Lung:  normal respiratory effort  Abdomen: abdomen is soft without significant tenderness, masses, organomegaly or guarding        Labs:   Lab Results   Component Value Date/Time    WBC 11.0 02/10/2023 09:17 AM    WBC 12.9 02/10/2023 05:15 AM    WBC 7.9 09/07/2021 03:45 PM    WBC 9.0 04/09/2021 03:08 PM    WBC 6.0 09/04/2020 10:07 AM    WBC 6.9 03/10/2020 11:20 AM    WBC 5.6 11/30/2019 08:00 PM    WBC 6.3 06/03/2019 04:33 PM    HGB 8.6 02/10/2023 09:17 AM    HGB 7.2 02/10/2023 05:15 AM    HGB 16.3 12/10/2022 07:56 PM    HGB 17.7 09/07/2021 03:49 PM    HGB 15.0 09/07/2021 03:45 PM    HGB 16.7 04/09/2021 03:15 PM    HGB 14.1 04/09/2021 03:08 PM    HGB 12.2 09/04/2020 10:07 AM    HGB 11.5 03/10/2020 11:20 AM    HGB 11.5 11/30/2019 08:00 PM    HGB 12.8 06/03/2019 04:33 PM    HCT 26.0 02/10/2023 09:17 AM    HCT 22.5 02/10/2023 05:15 AM    HCT 48 12/10/2022 07:56 PM    HCT 52 09/07/2021 03:49 PM    HCT 47.5 09/07/2021 03:45 PM    HCT 49 04/09/2021 03:15 PM    HCT 44.4 04/09/2021 03:08 PM    HCT  40.3 09/04/2020 10:07 AM    HCT 38.6 03/10/2020 11:20 AM    HCT 40.1 11/30/2019 08:00 PM    HCT 40.6 06/03/2019 04:33 PM    PLT 127 02/10/2023 09:17 AM    PLT 193 02/10/2023 05:15 AM    PLT 219 09/07/2021 03:45 PM    PLT 233 04/09/2021 03:08 PM    PLT 248 09/04/2020 10:07 AM    PLT 237 03/10/2020 11:20 AM    PLT 284 11/30/2019 08:00 PM    PLT 244 06/03/2019 04:33 PM       Recent Results (from the past 24 hour(s))   CBC W/O DIFF    Collection Time: 02/10/23  9:17 AM   Result Value Ref Range    WBC 11.0 4.0 - 11.0 1000/mm3    RBC 2.82 (  L) 3.60 - 5.20 M/uL    Hemoglobin 8.6 (L) 11.0 - 16.0 gm/dl    Hematocrit 16.1 (L) 35.0 - 47.0 %    MCV 92.2 80.0 - 98.0 fL    MCH 30.5 25.4 - 34.6 pg    MCHC 33.1 30.0 - 36.0 gm/dl    Platelets 096 (L) 045 - 450 1000/mm3    MPV 11.1 (H) 6.0 - 10.0 fL    RDW 46.4 (H) 36.4 - 46.3

## 2023-02-21 ENCOUNTER — Inpatient Hospital Stay
Admit: 2023-02-21 | Discharge: 2023-02-22 | Disposition: A | Discharge status: Home / Self Care | Payer: PRIVATE HEALTH INSURANCE | Attending: Emergency Medicine

## 2023-02-21 ENCOUNTER — Emergency Department: Admit: 2023-02-21 | Payer: PRIVATE HEALTH INSURANCE | Primary: Pediatrics

## 2023-02-21 DIAGNOSIS — R079 Chest pain, unspecified: Secondary | ICD-10-CM

## 2023-02-21 LAB — COMPREHENSIVE METABOLIC PANEL
ALT: 25 U/L (ref 10–49)
AST: 30 U/L (ref 0.0–33.9)
Albumin: 4.2 gm/dl (ref 3.4–5.0)
Alkaline Phosphatase: 79 U/L (ref 46–116)
Anion Gap: 8 mmol/L (ref 5–15)
BUN: 12 mg/dl (ref 9–23)
CO2: 25 mEq/L (ref 20–31)
Calcium: 9.5 mg/dl (ref 8.7–10.4)
Chloride: 109 mEq/L — ABNORMAL HIGH (ref 98–107)
Creatinine: 1.09 mg/dl — ABNORMAL HIGH (ref 0.55–1.02)
GFR African American: >60
GFR Non-African American: >60
Glucose: 103 mg/dl (ref 74–106)
Potassium: 3.4 mEq/L — ABNORMAL LOW (ref 3.5–5.1)
Sodium: 142 mEq/L (ref 136–145)
Total Bilirubin: 0.3 mg/dl (ref 0.30–1.20)
Total Protein: 7.1 gm/dl (ref 5.7–8.2)

## 2023-02-21 LAB — TROPONIN
Troponin, High Sensitivity: <3 ng/L (ref 0–34)
Troponin, High Sensitivity: <3 ng/L (ref 0–34)

## 2023-02-21 LAB — CBC WITH AUTO DIFFERENTIAL
Basophils: 0.5 % (ref 0–3)
Eosinophils: 2.7 % (ref 0–5)
Hematocrit: 36.7 % (ref 35.0–47.0)
Hemoglobin: 11.6 gm/dl (ref 11.0–16.0)
Immature Granulocytes %: 0.4 % (ref 0.0–3.0)
Lymphocytes: 18 % — ABNORMAL LOW (ref 28–48)
MCH: 30.2 pg (ref 25.4–34.6)
MCHC: 31.6 gm/dl (ref 30.0–36.0)
MCV: 95.6 fL (ref 80.0–98.0)
MPV: 10.1 fL — ABNORMAL HIGH (ref 6.0–10.0)
Monocytes: 5.7 % (ref 1–13)
Neutrophils Segmented: 72.7 % — ABNORMAL HIGH (ref 34–64)
Nucleated RBCs: 0 (ref 0–0)
Platelets: 344 10*3/uL (ref 140–450)
RBC: 3.84 M/uL (ref 3.60–5.20)
RDW: 58.3 — ABNORMAL HIGH (ref 36.4–46.3)
WBC: 8.5 10*3/uL (ref 4.0–11.0)

## 2023-02-21 LAB — EKG 12-LEAD
Atrial Rate: 88 {beats}/min
Calculated P Axis: 18 degrees
Calculated R Axis: 65 degrees
Calculated T Axis: 24 degrees
DIAGNOSIS, 93000: NORMAL
P-R Interval: 162 ms
Q-T Interval: 380 ms
QRS Duration: 94 ms
QTC Calculation (Bezet): 459 ms
Ventricular Rate: 88 {beats}/min

## 2023-02-21 LAB — LIPASE: Lipase: 35 U/L (ref 12–53)

## 2023-02-21 NOTE — ED Provider Notes (Signed)
[facility]  Emergency Department Treatment Report    Patient: [name] Allen Age: 27 y.o. Sex: female    Date of Birth: 1996-04-17 Admit Date: 02/21/2023 PCP: Gershon Cull, MD   MRN: [id number]  CSN: [id number]  Attending: Erling Conte, MD   Room: 112/EO12 Time Dictated: 2:32 AM APP: Shireen Quan, PA-C-C           Chief Complaint   Chief Complaint   Patient presents with    Chest Pain        History of Present Illness     27 y.o. female without significant past medical history presents to the emergency department complaints of chest heaviness dizziness that began this morning.  She states the pain had been constant.  It was not accompanied with nausea vomiting was not related to activity.  She has some concerns that she has had a recent miscarriage with blood transfusion and unsure if this was related to that process.    Review of Systems     Constitutional: No fever, chills, or weight loss  Eyes: No visual symptoms.  ENT: No sore throat, runny nose or ear pain.  Respiratory: No cough, dyspnea or wheezing.  Cardiovascular: chest pain, pressure, palpitations, tightness and  heaviness.  Gastrointestinal: No vomiting, diarrhea or abdominal pain.  Genitourinary: No dysuria, frequency, or urgency.  Musculoskeletal: No joint pain or swelling.  Integumentary: No rashes.  Neurological: No headaches, sensory or motor symptoms.  Denies complaints in all other systems.      Past Medical/Surgical History     Past Medical History:   Diagnosis Date    Asthma      Past Surgical History:   Procedure Laterality Date    DILATION AND CURETTAGE OF UTERUS N/A 02/10/2023    DILATATION AND CURETTAGE SUCTION performed by Rosado-Torres, Gillis Santa, MD at [facility] MAIN OR         Social History     Social History     Socioeconomic History    Marital status: Single   Tobacco Use    Smoking status: Every Day     Current packs/day: 0.25     Types: Cigarettes    Smokeless tobacco: Never   Substance and Sexual Activity     Alcohol use: Yes    Drug use: Yes     Types: Marijuana [name]     Social Determinants of Health     Food Insecurity: Patient Declined (02/10/2023)    Hunger Vital Sign     Worried About Running Out of Food in the Last Year: Patient declined     Ran Out of Food in the Last Year: Patient declined   Transportation Needs: Patient Declined (02/10/2023)    PRAPARE - Therapist, art (Medical): Patient declined     Lack of Transportation (Non-Medical): Patient declined   Housing Stability: Patient Declined (02/10/2023)    Housing Stability Vital Sign     Unable to Pay for Housing in the Last Year: Patient declined     Number of Places Lived in the Last Year: 1     Unstable Housing in the Last Year: Patient declined       Family History     Family History   Problem Relation Age of Onset    Diabetes Paternal Grandfather     Diabetes Maternal Grandmother        Current Medications     Discharge Medication List as of 02/21/2023  8:44 PM        CONTINUE these medications which have NOT CHANGED    Details   ferrous sulfate (IRON 325) 325 (65 Fe) MG tablet Take 1 tablet by mouth 2 times daily, Disp-180 tablet, R-1Normal               Allergies     No Known Allergies    Physical Exam     ED Triage Vitals [02/21/23 1421]   Enc Vitals Group      BP 125/66      Pulse 83      Respirations 18      Temp 97.5 F (36.4 C)      Temp Source Oral      SpO2 100 %      Weight - Scale 72.6 kg (160 lb)      Height 1.6 m (5\' 3" )       Constitutional: Patient appears well developed and well nourished.  Appearance and behavior are age and situation appropriate.  HEENT: Conjunctiva clear.  PERRL. Bilateral TM non edematous or erythematous. Mucous membranes moist, non-erythematous. Surface of the pharynx, palate, and tongue are pink, moist and without lesions.  Neck: supple, non tender, symmetrical, no masses or JVD.   Respiratory: lungs clear to auscultation, nonlabored respirations. No tachypnea or accessory muscle  use.  Cardiovascular: heart regular rate and rhythm without murmur rubs or gallops.    Distal pulses 2+ and equal bilaterally.  No peripheral edema.  Gastrointestinal:  Abdomen soft, nontender without complaint of pain to palpation. Normoactive bowel sounds. No peritoneal findings.   Musculoskeletal: Back/Spine: No stepoffs, no tenderness to palpation midline, , no lacerations/abrasions/ecchymosis  Ext: Warm/well perfused, DP/PT/radial pulses palpable bilaterally, no bony tenderness, no obvious deformities or lesions.  Integumentary: warm and dry without rashes or lesions  Neurologic: alert and oriented, Sensation intact, motor strength equal and symmetric.  No facial asymmetry or dysarthria.            Impression and Management Plan     27 year old female who presents to the emergency department with complaints of chest heaviness dizziness with concerns of etiology.  Will rule out anemia arrhythmia acute coronary syndrome.                Diagnostic Studies     Lab:   No results found for this or any previous visit (from the past 12 hour(s)).    Imaging:    XR CHEST (2 VW)   Final Result   IMPRESSION:   Heart size is normal. Lungs are clear and well aerated. No focal consolidation.      Electronically signed by: Delice Bison, MD 02/21/2023 2:57 PM EDT             Workstation ID: [id number]           Results for orders placed or performed during the hospital encounter of 02/21/23   XR CHEST (2 VW)    Narrative    INDICATION: Pain.  Chest pain, dizziness  TECHNIQUE: PA/lateral Chest Radiograph.  COMPARISON: 04/09/2021      Impression    IMPRESSION:  Heart size is normal. Lungs are clear and well aerated. No focal consolidation.    Electronically signed by: Delice Bison, MD 02/21/2023 2:57 PM EDT            Workstation ID: [id number]     Comprehensive Metabolic Panel   Result Value Ref Range    Potassium 3.4 (L) 3.5 - 5.1 mEq/L  Chloride 109 (H) 98 - 107 mEq/L    Sodium 142 136 - 145 mEq/L    CO2 25 20 - 31 mEq/L     Glucose 103 74 - 106 mg/dl    BUN 12 9 - 23 mg/dl    Creatinine 1.61 (H) 0.55 - 1.02 mg/dl    GFR African American >60.0      GFR Non-African American >60      Calcium 9.5 8.7 - 10.4 mg/dl    Anion Gap 8 5 - 15 mmol/L    AST 30.0 0.0 - 33.9 U/L    ALT 25 10 - 49 U/L    Alkaline Phosphatase 79 46 - 116 U/L    Total Bilirubin 0.30 0.30 - 1.20 mg/dl    Total Protein 7.1 5.7 - 8.2 gm/dl    Albumin 4.2 3.4 - 5.0 gm/dl   CBC with Auto Differential   Result Value Ref Range    WBC 8.5 4.0 - 11.0 1000/mm3    RBC 3.84 3.60 - 5.20 M/uL    Hemoglobin 11.6 11.0 - 16.0 gm/dl    Hematocrit 09.6 04.5 - 47.0 %    MCV 95.6 80.0 - 98.0 fL    MCH 30.2 25.4 - 34.6 pg    MCHC 31.6 30.0 - 36.0 gm/dl    Platelets 409 811 - 450 1000/mm3    MPV 10.1 (H) 6.0 - 10.0 fL    RDW 58.3 (H) 36.4 - 46.3      Nucleated RBCs 0 0 - 0      Immature Granulocytes % 0.4 0.0 - 3.0 %    Neutrophils Segmented 72.7 (H) 34 - 64 %    Lymphocytes 18.0 (L) 28 - 48 %    Monocytes 5.7 1 - 13 %    Eosinophils 2.7 0 - 5 %    Basophils 0.5 0 - 3 %   Troponin   Result Value Ref Range    Troponin, High Sensitivity <3 0 - 34 ng/L   Troponin   Result Value Ref Range    Troponin, High Sensitivity <3 0 - 34 ng/L   Lipase   Result Value Ref Range    Lipase 35 12 - 53 U/L   EKG 12 Lead within 10 mins of arrival   Result Value Ref Range    Ventricular Rate 88 BPM    Atrial Rate 88 BPM    P-R Interval 162 ms    QRS Duration 94 ms    Q-T Interval 380 ms    QTC Calculation (Bezet) 459 ms    Calculated P Axis 18 degrees    Calculated R Axis 65 degrees    Calculated T Axis 24 degrees    DIAGNOSIS, 93000       Normal sinus rhythm  Normal ECG  When compared with ECG of 09-Apr-2021 15:20,  Nonspecific T wave abnormality now evident in Inferior leads  T wave amplitude has decreased in Lateral leads  Confirmed by Adrian Blackwater ([id number]) on 02/21/2023 4:25:00 PM           EKG: Normal sinus rhythm with a rate of 88 bpm PR interval 162 ms QRS duration 94 ms with QT interval 380 ms QTc 459 ms  without ST-T wave elevation as read by myself under direct supervision of Dr. Tempie Hoist    ED Course/ Medical Decision Making      Medications   aspirin chewable tablet 81 mg (81 mg Oral Given 02/21/23 2108)  Patient given an aspirin while emergency department.    Patient with a CBC that did show hemoglobin 11.6 hematocrit of 36.7.  No evidence of anemia as interpreted by myself.  Chemistry within normal limits with a troponin that was flat negative EKG without ischemia heart score of 2.  Patient was stable for discharge to home.  She is to follow-up with primary care physician.  Return to the ED if symptoms this or worsen.        RECORDS REVIEWED:  I reviewed the patient's previous records here at [facility] and available outside facilities and note that patient evaluated in this emergency department 12/10/2022 for acute non-intractable headache    EXTERNAL RESULTS REVIEWED: Patient evaluated in this emergency department with admission 02/10/2023 through 02/11/2028 for miscarriage    INDEPENDENT HISTORIAN:  History and/or plan development assisted by: Patient    Severe exacerbation or progression of chronic illness: None      Threat to body function without evaluation and management: Cardiovascular      SOCIAL DETERMINANTS  impacting Evaluation and Management: Tobacco abuse      Comorbidities impacting Evaluation and Management:       Critical Care Time (if necessary)       I am the first provider for this patient.     I reviewed the vital signs, available nursing notes, past medical history, past surgical history, family history and social history.      I have spoken to Erling Conte, MD regarding this patient's care and we discussed past medical history, physical exam, ER course, lab results, imaging results, disposition plan and treatment        Final Diagnosis     1. Chest pain, unspecified type          Disposition          Medication List        ASK your doctor about these medications      ferrous sulfate  325 (65 Fe) MG tablet  Commonly known as: IRON 325  Take 1 tablet by mouth 2 times daily                 Patient discharged stable to home.  Follow-up with primary care physician.  Return to the ED if symptoms this or worsen.    Rita Ohara  February 22, 2023        The patient was personally evaluated by myself and Erling Conte, MD who agrees with the above assessment and plan.    My signature above authenticates this document and my orders, the final    diagnosis (es), discharge prescription (s), and instructions in the Epic    record.  If you have any questions please contact [phone].     Nursing notes have been reviewed by the physician/ advanced practice    Clinician.  Dragon medical dictation software was used for portions of this report. Unintended voice recognition errors may occur.                       Shireen Quan, [place]  02/23/23 [phone]

## 2023-02-21 NOTE — Discharge Instructions (Signed)
Follow up with primary care physician     Return to ER if symptoms worsen    Results for orders placed or performed during the hospital encounter of 02/21/23   XR CHEST (2 VW)    Narrative    INDICATION: Pain.  Chest pain, dizziness  TECHNIQUE: PA/lateral Chest Radiograph.  COMPARISON: 04/09/2021      Impression    IMPRESSION:  Heart size is normal. Lungs are clear and well aerated. No focal consolidation.    Electronically signed by: Delice Bison, MD 02/21/2023 2:57 PM EDT            Workstation ID: [id number]     Comprehensive Metabolic Panel   Result Value Ref Range    Potassium 3.4 (L) 3.5 - 5.1 mEq/L    Chloride 109 (H) 98 - 107 mEq/L    Sodium 142 136 - 145 mEq/L    CO2 25 20 - 31 mEq/L    Glucose 103 74 - 106 mg/dl    BUN 12 9 - 23 mg/dl    Creatinine 8.11 (H) 0.55 - 1.02 mg/dl    GFR African American >60.0      GFR Non-African American >60      Calcium 9.5 8.7 - 10.4 mg/dl    Anion Gap 8 5 - 15 mmol/L    AST 30.0 0.0 - 33.9 U/L    ALT 25 10 - 49 U/L    Alkaline Phosphatase 79 46 - 116 U/L    Total Bilirubin 0.30 0.30 - 1.20 mg/dl    Total Protein 7.1 5.7 - 8.2 gm/dl    Albumin 4.2 3.4 - 5.0 gm/dl   CBC with Auto Differential   Result Value Ref Range    WBC 8.5 4.0 - 11.0 1000/mm3    RBC 3.84 3.60 - 5.20 M/uL    Hemoglobin 11.6 11.0 - 16.0 gm/dl    Hematocrit 91.4 78.2 - 47.0 %    MCV 95.6 80.0 - 98.0 fL    MCH 30.2 25.4 - 34.6 pg    MCHC 31.6 30.0 - 36.0 gm/dl    Platelets 956 213 - 450 1000/mm3    MPV 10.1 (H) 6.0 - 10.0 fL    RDW 58.3 (H) 36.4 - 46.3      Nucleated RBCs 0 0 - 0      Immature Granulocytes % 0.4 0.0 - 3.0 %    Neutrophils Segmented 72.7 (H) 34 - 64 %    Lymphocytes 18.0 (L) 28 - 48 %    Monocytes 5.7 1 - 13 %    Eosinophils 2.7 0 - 5 %    Basophils 0.5 0 - 3 %   Troponin   Result Value Ref Range    Troponin, High Sensitivity <3 0 - 34 ng/L   Troponin   Result Value Ref Range    Troponin, High Sensitivity <3 0 - 34 ng/L   Lipase   Result Value Ref Range    Lipase 35 12 - 53 U/L   EKG 12 Lead  within 10 mins of arrival   Result Value Ref Range    Ventricular Rate 88 BPM    Atrial Rate 88 BPM    P-R Interval 162 ms    QRS Duration 94 ms    Q-T Interval 380 ms    QTC Calculation (Bezet) 459 ms    Calculated P Axis 18 degrees    Calculated R Axis 65 degrees    Calculated T Axis 24 degrees  DIAGNOSIS, 93000       Normal sinus rhythm  Normal ECG  When compared with ECG of 09-Apr-2021 15:20,  Nonspecific T wave abnormality now evident in Inferior leads  T wave amplitude has decreased in Lateral leads  Confirmed by Adrian Blackwater ([id number]) on 02/21/2023 4:25:00 PM

## 2023-02-21 NOTE — ED Notes (Signed)
Report given to Jasmine December, RN and Philip Aspen, Charity fundraiser. No longer primary      [name] Allen, [name]  02/21/23 Allen

## 2023-02-21 NOTE — ED Triage Notes (Signed)
Pt here with c/o chest heaviness & dizziness that began this morning ~ 1100 while watching TV.  Pt states she had a recent miscarriage & blood transfusion, unsure if those are r/t CP.

## 2023-02-21 NOTE — ED Notes (Signed)
Discharge instructions given to [name] Allen (name) with verbalization of understanding.  Patient accompanied by family. Patient discharged with the following prescriptions: none. Patient discharged to home/self-care with personal belongings (destination).       Larene Beach, RN  02/21/23 2109

## 2023-02-22 MED ORDER — ASPIRIN 81 MG PO CHEW
81 | Freq: Once | ORAL | Status: AC
Start: 2023-02-22 — End: 2023-02-21
  Administered 2023-02-22: 01:00:00 81 mg via ORAL

## 2023-02-22 MED ORDER — ASPIRIN 81 MG PO CHEW
81 | Freq: Every day | ORAL | Status: DC
Start: 2023-02-22 — End: 2023-02-21

## 2023-02-22 MED FILL — ASPIRIN 81 MG PO CHEW: 81 MG | ORAL | Qty: 1

## 2023-05-06 ENCOUNTER — Encounter: Payer: PRIVATE HEALTH INSURANCE | Attending: Internal Medicine | Primary: Pediatrics

## 2023-09-05 ENCOUNTER — Inpatient Hospital Stay
Admit: 2023-09-05 | Discharge: 2023-09-05 | Disposition: A | Discharge status: Home / Self Care | Payer: PRIVATE HEALTH INSURANCE | Attending: Student in an Organized Health Care Education/Training Program

## 2023-09-05 DIAGNOSIS — Z3A16 16 weeks gestation of pregnancy: Secondary | ICD-10-CM

## 2023-09-05 DIAGNOSIS — O26892 Other specified pregnancy related conditions, second trimester: Secondary | ICD-10-CM

## 2023-09-05 LAB — POCT URINALYSIS DIPSTICK
Bilirubin, Urine: NEGATIVE
Blood, Urine: NEGATIVE
Glucose, Ur: NEGATIVE mg/dL
Ketones, Urine: NEGATIVE mg/dL
Nitrite, Urine: NEGATIVE
Protein, Urine: NEGATIVE mg/dL
Specific Gravity, UA: 1.015 (ref 1.005–1.030)
Urobilinogen, Urine: 0.2 U/dL (ref 0.0–1.0)
pH, Urine: 8 (ref 5–9)

## 2023-09-05 LAB — MICROSCOPIC URINALYSIS

## 2023-09-05 LAB — C.TRACHOMATIS N.GONORRHOEAE DNA
Chlamydia Trachomatis DNA, SDA: NOT DETECTED
NEISSERIA GONORRHOEAE, DNA: NOT DETECTED

## 2023-09-05 LAB — WET PREP, GENITAL: Wet Prep: NONE SEEN

## 2023-09-05 LAB — POC GLUCOSE FINGERSTICK: POC Glucose: 87 mg/dL (ref 65–105)

## 2023-09-05 NOTE — ED Notes (Signed)
Urine sent to the lab     Gibson Ramp, RN  09/05/23 1017

## 2023-09-05 NOTE — ED Notes (Signed)
Specimens labeled, collected in chart, and sent to lab.     Avon Gully, RN  09/05/23 1350

## 2023-09-05 NOTE — ED Triage Notes (Signed)
C/o vaginal pressure for 1.5wks & then discharge for past several days  Headache for several weeks as well

## 2023-09-05 NOTE — Discharge Instructions (Addendum)
Follow-up with your OB  Encourage fluid intake to maintain hydration  Your vaginal cultures are in process, results to be available on MyChart, we will also call notify if they are positive  Return to the ER for any worsening symptoms, new symptoms developing concerns      YOU SHOULD RETURN TO THE EMERGENCY DEPARTMENT IMMEDIATELY IF ANY OF THE FOLLOWING OCCUR:   - Increasing pain in the abdomen, pelvis or back.   - Increasing amounts of vaginal bleeding, soaking of pads/tampons (more than one pad per hour),    - Dizziness, lightheadedness or fainting.   - Nausea, vomiting, fever, chills.

## 2023-09-05 NOTE — ED Provider Notes (Cosign Needed)
[facility]  Emergency Department Treatment Report        Patient: JEZLYNN MANICK Age: 27 y.o. Sex: female    Date of Birth: [birth year] Admit Date: 09/05/2023 PCP: Gershon Cull, MD   MRN: [id number]  CSN: [id number]  Attending [place], Matthew Robert, *    Room: OTF/OTF Time Dictated: 4:57 PM APP: Willette Brace, FNP-C            Chief Complaint   Chief Complaint   Patient presents with    Headache    Vaginal Discharge    Groin Swelling       History of Present Illness   This is a 27 y.o. female who is [redacted] weeks pregnant, G4, P2 (Twins) 63M 1AB, who presents to the ER with complaints of lower abdominal cramping, vaginal pressure and sinus headache.  Patient states that she has been sneezing for about a week, feels pressure to the right side of her nose, also having lower abdominal cramping and vaginal pressure after she urinates.   She denies any vaginal bleeding, loss of fluids, chest pain, shortness of breath dizziness, blurred vision, numbness, tingling or weakness,   Patient's triage note had reported vaginal discharge, patient states that it was a single episode of discharge on Wednesday, after walking up the stairs, she denies any other episodes of discharge.   Patient states that she seen her OB with [facility], 1 month ago, has an appointment next Thursday.   She states her last miscarriage April earlier this year  She reports last having intercourse on Monday    Review of Systems   Review of Systems   Constitutional:  Negative for activity change and fever.   Respiratory:  Negative for chest tightness and shortness of breath.    Cardiovascular:  Negative for chest pain.   Gastrointestinal:  Negative for abdominal pain, nausea and vomiting.   Genitourinary:  Positive for pelvic pain. Negative for dysuria and hematuria.   Musculoskeletal:  Negative for back pain and neck pain.   Neurological:  Positive for headaches. Negative for weakness and numbness.           Past Medical/Surgical History      Past Medical History:   Diagnosis Date    Asthma      Past Surgical History:   Procedure Laterality Date    DILATION AND CURETTAGE OF UTERUS N/A 02/10/2023    DILATATION AND CURETTAGE SUCTION performed by Rosado-Torres, Gillis Santa, MD at [facility] MAIN OR       Social History     Social History     Socioeconomic History    Marital status: Single     Spouse name: Not on file    Number of children: Not on file    Years of education: Not on file    Highest education level: Not on file   Occupational History    Not on file   Tobacco Use    Smoking status: Every Day     Current packs/day: 0.25     Types: Cigarettes    Smokeless tobacco: Never   Substance and Sexual Activity    Alcohol use: Yes    Drug use: Yes     Types: Marijuana [name]    Sexual activity: Not on file   Other Topics Concern    Not on file   Social History Narrative    Not on file     Social Determinants of Health     Financial  Resource Strain: Not on file   Food Insecurity: Patient Declined (02/10/2023)    Hunger Vital Sign     Worried About Running Out of Food in the Last Year: Patient declined     Ran Out of Food in the Last Year: Patient declined   Transportation Needs: Patient Declined (02/10/2023)    PRAPARE - Therapist, art (Medical): Patient declined     Lack of Transportation (Non-Medical): Patient declined   Physical Activity: Not on file   Stress: Not on file   Social Connections: Not on file   Intimate Partner Violence: Not on file   Housing Stability: Patient Declined (02/10/2023)    Housing Stability Vital Sign     Unable to Pay for Housing in the Last Year: Patient declined     Number of Places Lived in the Last Year: 1     Unstable Housing in the Last Year: Patient declined       Family History     Family History   Problem Relation Age of Onset    Diabetes Paternal Grandfather     Diabetes Maternal Grandmother         Current Medications     No current facility-administered medications for this encounter.     Current  Outpatient Medications   Medication Sig Dispense Refill    ferrous sulfate (IRON 325) 325 (65 Fe) MG tablet Take 1 tablet by mouth 2 times daily 180 tablet 1       Allergies   algNo Known Allergies    Physical Exam   Patient Vitals for the past 24 hrs:   Temp Pulse Resp BP SpO2   09/05/23 1420 98.2 F (36.8 C) 74 18 120/80 100 %   09/05/23 0941 98.4 F (36.9 C) 82 18 128/70 100 %     Physical Exam  Constitutional:       Appearance: Normal appearance.   HENT:      Head: Normocephalic and atraumatic.   Cardiovascular:      Rate and Rhythm: Normal rate and regular rhythm.   Pulmonary:      Effort: Pulmonary effort is normal.      Breath sounds: Normal breath sounds.   Abdominal:      General: Abdomen is flat.      Tenderness: There is no abdominal tenderness.   Musculoskeletal:         General: Normal range of motion.      Cervical back: Normal range of motion and neck supple.   Skin:     General: Skin is warm and dry.      Capillary Refill: Capillary refill takes less than 2 seconds.   Neurological:      General: No focal deficit present.      Mental Status: She is alert and oriented to person, place, and time.           Impression and Management Plan   Patient is a 27 year old female who presents to the ER with reports of pelvic pressure in setting of pregnancy    DDx includes but is not limited to: ectopic pregnancy, threatened miscarriage, spontaneous miscarriage, post-coital bleeding, others        Diagnostic Studies   Lab:   Results for orders placed or performed during the hospital encounter of 09/05/23   Wet prep, genital    Specimen: Genital   Result Value Ref Range    Wet Prep  No Yeast or motile Trichomonas seen  No Clue Cells Seen     C.trachomatis N.gonorrhoeae DNA    Specimen: Genital   Result Value Ref Range    Chlamydia Trachomatis DNA, SDA NOT DETECTED NOT DETECTED      NEISSERIA GONORRHOEAE, DNA NOT DETECTED NOT DETECTED     Microscopic Urinalysis   Result Value Ref Range    Squam Epithel, UA  10-14 NEGATIVE,OCCASIONAL,1-4,5-9,10-14,15-29 /LPF    WBC, UA OCCASIONAL (A) NEGATIVE /HPF    BACTERIA, URINE OCCASIONAL (A) NEGATIVE /HPF   POCT Urinalysis no Micro   Result Value Ref Range    Glucose, Ur Negative NEGATIVE,Negative mg/dl    Bilirubin, Urine Negative NEGATIVE,Negative      Ketones, Urine Negative NEGATIVE,Negative mg/dl    Specific Gravity, UA 1.015 1.005 - 1.030      Blood, Urine Negative NEGATIVE,Negative      pH, Urine 8.0 5 - 9      Protein, Urine Negative NEGATIVE,Negative mg/dl    Urobilinogen, Urine 0.2 0.0 - 1.0 EU/dl    Nitrite, Urine Negative NEGATIVE,Negative      Leukocyte Esterase, Urine Moderate (A) NEGATIVE,Negative      Color, UA Yellow      Clarity, UA Clear     POC Glucose Fingerstick   Result Value Ref Range    POC Glucose 87 65 - 105 mg/dL         Imaging:    No results found.           Other studies:  My interpretation of other studies is that they show, among other things, urinalysis with moderate leukocytosis, occasional WBCs I suspect contamination      Bedside Ultrasound    Date/Time: 09/05/2023 11:55 AM    Performed by: Apolonio Schneiders, APRN - NP  Authorized by: [place], Matthew Robert, MD    Performed by:  NP  Type of procedure:  Focused pelvic ultrasound obstetrical  Indications:  Pregnant by patient history  Transabdominal sagittal:  Adequate  Transabdominal transverse:  Adequate  Intrauterine Pregnancy:  Present  Fetal heart    FHR (bpm):  148  Fetal motion    Interpretation:  Live intrauterine pregnancy      ED Course / Medical Decision Making        Patient is overall well-appearing, nontoxic and in no acute distress, her abdomen is appropriately distended in setting of pregnancy  Nontender on exam  Patient denies any vaginal bleeding or loss of fluids  Shared decision making with patient, who elected to self swab given her reports of vaginal discharge,  Patient is normotensive, afebrile, O2 sats 100% on room air  Urinalysis negative for ketones, and infection.    Patient has no history of eclampsia  Patient's blood glucose 87, she is not diabetic  Patient structured to follow-up with her OB/GYN  Patient made aware that her vaginal exams available on MyChart, we will also call notify if they are positive  Return to the ER for any worsening symptoms, new symptoms develop any concerns        RECORDS REVIEWED:  I reviewed the patient's previous records here at [facility] and available outside facilities and note that patient seen on 02/21/2023 with complaints of chest pain        INDEPENDENT HISTORIAN:  History and/or plan development assisted by: Patient    Severe exacerbation or progression of chronic illness: Pelvic pain      Threat to body function without evaluation and management: Reproductive  SOCIAL DETERMINANTS  impacting Evaluation and Management: Personal health habits, health services, gender        Medications - No data to display        Final Diagnosis       ICD-10-CM    1. [redacted] weeks gestation of pregnancy  Z3A.16       2. Pelvic pain during pregnancy  O26.899     R10.2       3. Sinus headache  R51.9                Disposition       Patient was discharged home in stable condition with discharge instructions on the same.   Return for persistent bleeding, purulent drainage, symptoms worsen or any other concerns  Return to the ER if condition worsens or new symptoms develop.   Follow up with primary care as discussed.     Ernst Spell, NP   September 05, 2023            The patient was personally evaluated by myself and discussed with [place], Matilde Bash, * who agrees with the above assessment and plan.    My signature above authenticates this document and my orders, the final    diagnosis (es), discharge prescription (s), and instructions in the Epic    record.  If you have any questions please contact [phone].     Nursing notes have been reviewed by the physician/ advanced practice    Clinician.                            Apolonio Schneiders, APRN - NP  09/05/23  [phone]

## 2023-09-05 NOTE — ED Notes (Signed)
Glucose checked.  Vitals updated @ 1410       Earnstine Regal  09/05/23 1421
